# Patient Record
Sex: Female | Born: 1940 | Race: White | Hispanic: No | Marital: Single | Smoking: Never smoker
Health system: Southern US, Community
[De-identification: ages and names within clinical notes are randomized; demographics above are authoritative.]

## PROBLEM LIST (undated history)

## (undated) DIAGNOSIS — M797 Fibromyalgia: Secondary | ICD-10-CM

## (undated) DIAGNOSIS — E119 Type 2 diabetes mellitus without complications: Secondary | ICD-10-CM

## (undated) DIAGNOSIS — J8410 Pulmonary fibrosis, unspecified: Secondary | ICD-10-CM

## (undated) DIAGNOSIS — N189 Chronic kidney disease, unspecified: Secondary | ICD-10-CM

## (undated) DIAGNOSIS — I2510 Atherosclerotic heart disease of native coronary artery without angina pectoris: Secondary | ICD-10-CM

## (undated) DIAGNOSIS — M1990 Unspecified osteoarthritis, unspecified site: Secondary | ICD-10-CM

## (undated) DIAGNOSIS — K219 Gastro-esophageal reflux disease without esophagitis: Secondary | ICD-10-CM

## (undated) DIAGNOSIS — Z8719 Personal history of other diseases of the digestive system: Secondary | ICD-10-CM

## (undated) DIAGNOSIS — I10 Essential (primary) hypertension: Secondary | ICD-10-CM

## (undated) DIAGNOSIS — C801 Malignant (primary) neoplasm, unspecified: Secondary | ICD-10-CM

## (undated) HISTORY — PX: COLONOSCOPY: SHX174

## (undated) HISTORY — DX: Pulmonary fibrosis, unspecified: J84.10

## (undated) HISTORY — PX: BREAST BIOPSY: SHX20

## (undated) HISTORY — PX: TONSILLECTOMY: SUR1361

## (undated) HISTORY — PX: ABDOMINAL HYSTERECTOMY: SHX81

## (undated) HISTORY — PX: OTHER SURGICAL HISTORY: SHX169

---

## 1998-09-02 ENCOUNTER — Other Ambulatory Visit: Admission: RE | Admit: 1998-09-02 | Discharge: 1998-09-02 | Payer: Self-pay | Admitting: Obstetrics and Gynecology

## 2013-02-26 ENCOUNTER — Ambulatory Visit: Payer: Self-pay | Admitting: Internal Medicine

## 2014-05-03 ENCOUNTER — Emergency Department: Payer: Self-pay | Admitting: Student

## 2014-08-12 ENCOUNTER — Ambulatory Visit: Payer: Self-pay | Admitting: Internal Medicine

## 2015-03-18 ENCOUNTER — Other Ambulatory Visit: Payer: Self-pay | Admitting: Gastroenterology

## 2015-03-18 DIAGNOSIS — R188 Other ascites: Secondary | ICD-10-CM

## 2015-03-24 ENCOUNTER — Other Ambulatory Visit (HOSPITAL_COMMUNITY): Payer: Self-pay

## 2015-10-29 ENCOUNTER — Other Ambulatory Visit: Payer: Self-pay | Admitting: Student

## 2015-10-29 DIAGNOSIS — R7989 Other specified abnormal findings of blood chemistry: Secondary | ICD-10-CM

## 2015-10-29 DIAGNOSIS — R945 Abnormal results of liver function studies: Principal | ICD-10-CM

## 2015-11-03 ENCOUNTER — Ambulatory Visit
Admission: RE | Admit: 2015-11-03 | Discharge: 2015-11-03 | Disposition: A | Discharge status: Home / Self Care | Payer: Medicare Other | Source: Ambulatory Visit | Attending: Student | Admitting: Student

## 2015-11-03 DIAGNOSIS — K8020 Calculus of gallbladder without cholecystitis without obstruction: Secondary | ICD-10-CM | POA: Insufficient documentation

## 2015-11-03 DIAGNOSIS — R945 Abnormal results of liver function studies: Secondary | ICD-10-CM

## 2015-11-03 DIAGNOSIS — R748 Abnormal levels of other serum enzymes: Secondary | ICD-10-CM | POA: Diagnosis present

## 2015-11-03 DIAGNOSIS — R7989 Other specified abnormal findings of blood chemistry: Secondary | ICD-10-CM

## 2016-10-17 ENCOUNTER — Encounter: Payer: Self-pay | Admitting: Emergency Medicine

## 2016-10-17 ENCOUNTER — Emergency Department
Admission: EM | Admit: 2016-10-17 | Discharge: 2016-10-17 | Disposition: A | Discharge status: Home / Self Care | Payer: Medicare Other | Attending: Emergency Medicine | Admitting: Emergency Medicine

## 2016-10-17 DIAGNOSIS — R202 Paresthesia of skin: Secondary | ICD-10-CM | POA: Insufficient documentation

## 2016-10-17 DIAGNOSIS — Z5321 Procedure and treatment not carried out due to patient leaving prior to being seen by health care provider: Secondary | ICD-10-CM | POA: Diagnosis not present

## 2016-10-17 DIAGNOSIS — E119 Type 2 diabetes mellitus without complications: Secondary | ICD-10-CM | POA: Diagnosis not present

## 2016-10-17 DIAGNOSIS — I2510 Atherosclerotic heart disease of native coronary artery without angina pectoris: Secondary | ICD-10-CM | POA: Diagnosis not present

## 2016-10-17 HISTORY — DX: Type 2 diabetes mellitus without complications: E11.9

## 2016-10-17 HISTORY — DX: Atherosclerotic heart disease of native coronary artery without angina pectoris: I25.10

## 2016-10-17 HISTORY — DX: Fibromyalgia: M79.7

## 2016-10-17 HISTORY — DX: Unspecified osteoarthritis, unspecified site: M19.90

## 2016-10-17 NOTE — ED Triage Notes (Signed)
Pt also c/o fatigue and feeling drained, doesn't feel like doing anything except sleeping.

## 2016-10-17 NOTE — ED Notes (Signed)
Pt does not want EKG or labs until sees a doctor.

## 2016-10-17 NOTE — ED Notes (Addendum)
Pt now does not wish to wait to see a doctor.  Will return if wants to be seen.  NAD

## 2016-10-17 NOTE — ED Triage Notes (Signed)
Pt c/o tingling to left ear/side of face for 2-3 weeks. No numbness or pain per pt.  When turns head certain ways it makes the tingling start. When lays on pillow certain ways the tingling starts.

## 2016-10-23 ENCOUNTER — Other Ambulatory Visit: Payer: Self-pay | Admitting: Internal Medicine

## 2016-10-23 DIAGNOSIS — Z1231 Encounter for screening mammogram for malignant neoplasm of breast: Secondary | ICD-10-CM

## 2016-11-22 ENCOUNTER — Ambulatory Visit
Admission: RE | Admit: 2016-11-22 | Discharge: 2016-11-22 | Disposition: A | Discharge status: Home / Self Care | Payer: Medicare Other | Source: Ambulatory Visit | Attending: Internal Medicine | Admitting: Internal Medicine

## 2016-11-22 DIAGNOSIS — Z1231 Encounter for screening mammogram for malignant neoplasm of breast: Secondary | ICD-10-CM | POA: Insufficient documentation

## 2016-11-22 HISTORY — DX: Malignant (primary) neoplasm, unspecified: C80.1

## 2017-03-06 ENCOUNTER — Other Ambulatory Visit: Payer: Self-pay | Admitting: Internal Medicine

## 2017-03-06 DIAGNOSIS — M542 Cervicalgia: Secondary | ICD-10-CM

## 2017-03-12 ENCOUNTER — Ambulatory Visit
Admission: RE | Admit: 2017-03-12 | Discharge: 2017-03-12 | Disposition: A | Discharge status: Home / Self Care | Payer: Medicare Other | Source: Ambulatory Visit | Attending: Internal Medicine | Admitting: Internal Medicine

## 2017-03-12 DIAGNOSIS — M47892 Other spondylosis, cervical region: Secondary | ICD-10-CM | POA: Diagnosis not present

## 2017-03-12 DIAGNOSIS — M4802 Spinal stenosis, cervical region: Secondary | ICD-10-CM | POA: Insufficient documentation

## 2017-03-12 DIAGNOSIS — M542 Cervicalgia: Secondary | ICD-10-CM

## 2017-05-11 ENCOUNTER — Other Ambulatory Visit: Payer: Self-pay | Admitting: Acute Care

## 2017-05-11 DIAGNOSIS — R202 Paresthesia of skin: Secondary | ICD-10-CM

## 2017-05-23 ENCOUNTER — Ambulatory Visit
Admission: RE | Admit: 2017-05-23 | Discharge: 2017-05-23 | Disposition: A | Discharge status: Home / Self Care | Payer: Medicare Other | Source: Ambulatory Visit | Attending: Acute Care | Admitting: Acute Care

## 2017-05-23 DIAGNOSIS — R202 Paresthesia of skin: Secondary | ICD-10-CM | POA: Diagnosis present

## 2017-05-23 DIAGNOSIS — R9082 White matter disease, unspecified: Secondary | ICD-10-CM | POA: Diagnosis not present

## 2017-05-23 DIAGNOSIS — G319 Degenerative disease of nervous system, unspecified: Secondary | ICD-10-CM | POA: Diagnosis not present

## 2017-09-03 ENCOUNTER — Encounter: Payer: Self-pay | Admitting: Emergency Medicine

## 2017-09-03 ENCOUNTER — Emergency Department: Payer: Medicare Other

## 2017-09-03 ENCOUNTER — Emergency Department
Admission: EM | Admit: 2017-09-03 | Discharge: 2017-09-03 | Disposition: A | Discharge status: Home / Self Care | Payer: Medicare Other | Attending: Emergency Medicine | Admitting: Emergency Medicine

## 2017-09-03 ENCOUNTER — Other Ambulatory Visit: Payer: Self-pay

## 2017-09-03 DIAGNOSIS — Z85828 Personal history of other malignant neoplasm of skin: Secondary | ICD-10-CM | POA: Insufficient documentation

## 2017-09-03 DIAGNOSIS — I2510 Atherosclerotic heart disease of native coronary artery without angina pectoris: Secondary | ICD-10-CM | POA: Diagnosis not present

## 2017-09-03 DIAGNOSIS — R05 Cough: Secondary | ICD-10-CM | POA: Diagnosis present

## 2017-09-03 DIAGNOSIS — Z7984 Long term (current) use of oral hypoglycemic drugs: Secondary | ICD-10-CM | POA: Diagnosis not present

## 2017-09-03 DIAGNOSIS — J209 Acute bronchitis, unspecified: Secondary | ICD-10-CM | POA: Diagnosis not present

## 2017-09-03 DIAGNOSIS — E119 Type 2 diabetes mellitus without complications: Secondary | ICD-10-CM | POA: Insufficient documentation

## 2017-09-03 MED ORDER — PREDNISONE 50 MG PO TABS: ORAL_TABLET | ORAL | 0 refills | Status: DC

## 2017-09-03 MED ORDER — AZITHROMYCIN 250 MG PO TABS: ORAL_TABLET | ORAL | 0 refills | Status: AC

## 2017-09-03 NOTE — ED Triage Notes (Signed)
Pt reports non productive cough and sore throat for one month. Denies NVD. Reports chest wall pain from coughing. No apparent distress noted in triage.

## 2017-09-03 NOTE — ED Triage Notes (Signed)
First Nurse Note:  Arrives with C/O dry cough x 1 month.  No SOB/ DOE.  NAD

## 2017-09-03 NOTE — ED Notes (Signed)
See triage note states she developed cough about 1 month ago  Had fever at the beginning  Afebrile at present

## 2017-09-03 NOTE — ED Provider Notes (Signed)
[facility] Emergency Department Provider Note  ____________________________________________  Time seen: Approximately 6:23 PM  I have reviewed the triage vital signs and the nursing notes.   HISTORY  Chief Complaint Cough    HPI Claudia Schaefer is a 76 y.o. female presents to the emergency department with productive cough for approximately 1 month.  Cough is productive for clear to white sputum production.  Patient has experienced intermittent shortness of breath but no chest tightness or chest pain.  She has been tolerating fluids by mouth and her own secretions.  No major changes in stooling or urinary habits.  No alleviating measures have been attempted.    Past Medical History:  Diagnosis Date  . CAD (coronary artery disease)   . Cancer ([place])    skin  . Diabetes mellitus without complication ([place])   . Fibromyalgia   . Osteoarthritis     There are no active problems to display for this patient.   Past Surgical History:  Procedure Laterality Date  . ABDOMINAL HYSTERECTOMY    . BREAST BIOPSY Right    neg  . CESAREAN SECTION    . TONSILLECTOMY      Prior to Admission medications   Medication Sig Start Date End Date Taking? Authorizing Provider  atorvastatin (LIPITOR) 40 MG tablet Take 40 mg by mouth daily.   Yes [provider]  diltiazem (CARDIZEM CD) 240 MG 24 hr capsule Take 240 mg by mouth daily.   Yes [provider]  esomeprazole (NEXIUM) 40 MG capsule Take 40 mg by mouth daily at 12 noon.   Yes [provider]  glimepiride (AMARYL) 4 MG tablet Take 4 mg by mouth daily with breakfast.   Yes [provider]  isosorbide mononitrate (IMDUR) 60 MG 24 hr tablet Take 60 mg by mouth daily.   Yes [provider]  metFORMIN (GLUCOPHAGE) 500 MG tablet Take 500 mg by mouth 2 (two) times daily with a meal.   Yes [provider]  venlafaxine XR (EFFEXOR-XR) 150 MG 24 hr capsule Take 150 mg by  mouth daily with breakfast.   Yes [provider]  azithromycin (ZITHROMAX Z-PAK) 250 MG tablet Take 2 tablets (500 mg) on  Day 1,  followed by 1 tablet (250 mg) once daily on Days 2 through 5. 09/03/17 09/08/17  Lannie Fields, PA-C  predniSONE (DELTASONE) 50 MG tablet Take one tablet daily by mouth for the next five days. 09/03/17   Lannie Fields, PA-C    Allergies Penicillins and Sulfa antibiotics  Family History  Problem Relation Age of Onset  . Breast cancer Neg Hx     Social History Social History   Tobacco Use  . Smoking status: Never Smoker  . Smokeless tobacco: Never Used  Substance Use Topics  . Alcohol use: No  . Drug use: No     Review of Systems  Constitutional: No fever/chills Eyes: No visual changes. No discharge ENT: No upper respiratory complaints. Cardiovascular: no chest pain. Respiratory: Patient has productive cough . Patient has intermittent shortness of breath.  Gastrointestinal: No abdominal pain.  No nausea, no vomiting.  No diarrhea.  No constipation. Musculoskeletal: Negative for musculoskeletal pain. Skin: Negative for rash, abrasions, lacerations, ecchymosis. Neurological: Negative for headaches, focal weakness or numbness.   ____________________________________________   PHYSICAL EXAM:  VITAL SIGNS: ED Triage Vitals  Enc Vitals Group     BP 09/03/17 1342 126/77     Pulse Rate 09/03/17 1342 91  Resp 09/03/17 1342 18     Temp 09/03/17 1342 98.1 F (36.7 C)     Temp Source 09/03/17 1342 Oral     SpO2 09/03/17 1342 97 %     Weight 09/03/17 1345 120 lb (54.4 kg)     Height 09/03/17 1345 4\' 11"  (1.499 m)     Head Circumference --      Peak Flow --      Pain Score 09/03/17 1555 0     Pain Loc --      Pain Edu? --      Excl. in [place]? --     Constitutional: Alert and oriented. Well appearing and in no acute distress. Eyes: Conjunctivae are normal. PERRL. EOMI. Head: Atraumatic. ENT:      Ears: TMs are pearly  bilaterally.       Nose: No congestion/rhinnorhea.      Mouth/Throat: Mucous membranes are moist.  Neck: No stridor. FROM. Hematological/Lymphatic/Immunilogical: No cervical lymphadenopathy.  Cardiovascular: Normal rate, regular rhythm. Normal S1 and S2.  Good peripheral circulation. Respiratory: Normal respiratory effort without tachypnea or retractions. Lungs CTAB. Good air entry to the bases with no decreased or absent breath sounds. Gastrointestinal: Bowel sounds 4 quadrants. Soft and nontender to palpation. No guarding or rigidity. No palpable masses. No distention. No CVA tenderness. Musculoskeletal: Full range of motion to all extremities. No gross deformities appreciated. Neurologic:  Normal speech and language. No gross focal neurologic deficits are appreciated.  Skin:  Skin is warm, dry and intact. No rash noted. Psychiatric: Mood and affect are normal. Speech and behavior are normal. Patient exhibits appropriate insight and judgement.  ____________________________________________   LABS (all labs ordered are listed, but only abnormal results are displayed)  Labs Reviewed - No data to display ____________________________________________  EKG   ____________________________________________  RADIOLOGY Unk Pinto, personally viewed and evaluated these images (plain radiographs) as part of my medical decision making, as well as reviewing the written report by the radiologist.  Dg Chest 2 View  Result Date: 09/03/2017 CLINICAL DATA:  Cough and sore throat. EXAM: CHEST  2 VIEW COMPARISON:  None. FINDINGS: The heart size is normal. The thoracic aorta demonstrates calcified plaque and mild ectasia. There is some scarring in the lingula and left lower lobe. There is no evidence of pulmonary edema, consolidation, pneumothorax, nodule or pleural fluid. The bony thorax shows mild degenerative disc disease of the thoracic spine. IMPRESSION: No acute findings.  Scarring in the  lingula and left lower lobe. Electronically Signed   By: Aletta Edouard M.D.   On: 09/03/2017 15:15    ____________________________________________    PROCEDURES  Procedure(s) performed:    Procedures    Medications - No data to display   ____________________________________________   INITIAL IMPRESSION / ASSESSMENT AND PLAN / ED COURSE  Pertinent labs & imaging results that were available during my care of the patient were reviewed by me and considered in my medical decision making (see chart for details).  Review of the [place] CSRS was performed in accordance of the [place] prior to dispensing any controlled drugs.     Assessment and Plan: Acute bronchitis Patient presents to with productive cough for approximately 1 month.  Chest x-ray reveals no consolidations or findings consistent with pneumonia.  Patient was treated empirically with azithromycin and prednisone.  She was advised to follow-up with primary care as needed.  All patient questions were answered.   ____________________________________________  FINAL CLINICAL IMPRESSION(S) / ED DIAGNOSES  Final diagnoses:  Acute bronchitis, unspecified organism      NEW MEDICATIONS STARTED DURING THIS VISIT:  ED Discharge Orders        Ordered    azithromycin (ZITHROMAX Z-PAK) 250 MG tablet     09/03/17 1551    predniSONE (DELTASONE) 50 MG tablet     09/03/17 1551          This chart was dictated using voice recognition software/Dragon. Despite best efforts to proofread, errors can occur which can change the meaning. Any change was purely unintentional.    Lannie Fields, PA-C 09/03/17 1831    Delman Kitten, MD 09/13/17 [phone]

## 2018-08-04 ENCOUNTER — Encounter: Payer: Self-pay | Admitting: Emergency Medicine

## 2018-08-04 ENCOUNTER — Emergency Department
Admission: EM | Admit: 2018-08-04 | Discharge: 2018-08-04 | Disposition: A | Discharge status: Home / Self Care | Payer: Medicare Other | Attending: Emergency Medicine | Admitting: Emergency Medicine

## 2018-08-04 ENCOUNTER — Emergency Department: Payer: Medicare Other

## 2018-08-04 ENCOUNTER — Other Ambulatory Visit: Payer: Self-pay

## 2018-08-04 DIAGNOSIS — Z7984 Long term (current) use of oral hypoglycemic drugs: Secondary | ICD-10-CM | POA: Diagnosis not present

## 2018-08-04 DIAGNOSIS — I2510 Atherosclerotic heart disease of native coronary artery without angina pectoris: Secondary | ICD-10-CM | POA: Insufficient documentation

## 2018-08-04 DIAGNOSIS — E860 Dehydration: Secondary | ICD-10-CM | POA: Diagnosis not present

## 2018-08-04 DIAGNOSIS — E119 Type 2 diabetes mellitus without complications: Secondary | ICD-10-CM | POA: Insufficient documentation

## 2018-08-04 DIAGNOSIS — Z79899 Other long term (current) drug therapy: Secondary | ICD-10-CM | POA: Diagnosis not present

## 2018-08-04 DIAGNOSIS — K8500 Idiopathic acute pancreatitis without necrosis or infection: Secondary | ICD-10-CM | POA: Diagnosis not present

## 2018-08-04 DIAGNOSIS — Z85828 Personal history of other malignant neoplasm of skin: Secondary | ICD-10-CM | POA: Diagnosis not present

## 2018-08-04 DIAGNOSIS — R079 Chest pain, unspecified: Secondary | ICD-10-CM | POA: Diagnosis present

## 2018-08-04 LAB — COMPREHENSIVE METABOLIC PANEL
ALK PHOS: 63 U/L (ref 38–126)
ALT: 34 U/L (ref 0–44)
ANION GAP: 11 (ref 5–15)
AST: 31 U/L (ref 15–41)
Albumin: 4 g/dL (ref 3.5–5.0)
BUN: 34 mg/dL — ABNORMAL HIGH (ref 8–23)
CALCIUM: 9.7 mg/dL (ref 8.9–10.3)
CO2: 25 mmol/L (ref 22–32)
CREATININE: 1.25 mg/dL — AB (ref 0.44–1.00)
Chloride: 103 mmol/L (ref 98–111)
GFR, EST AFRICAN AMERICAN: 48 mL/min — AB (ref >60)
GFR, EST NON AFRICAN AMERICAN: 41 mL/min — AB (ref >60)
Glucose, Bld: 297 mg/dL — ABNORMAL HIGH (ref 70–99)
Potassium: 3.9 mmol/L (ref 3.5–5.1)
SODIUM: 139 mmol/L (ref 135–145)
Total Bilirubin: 0.6 mg/dL (ref 0.3–1.2)
Total Protein: 7.5 g/dL (ref 6.5–8.1)

## 2018-08-04 LAB — CBC
HCT: 37.2 % (ref 36.0–46.0)
Hemoglobin: 12.5 g/dL (ref 12.0–15.0)
MCH: 32.1 pg (ref 26.0–34.0)
MCHC: 33.6 g/dL (ref 30.0–36.0)
MCV: 95.4 fL (ref 80.0–100.0)
Platelets: 301 10*3/uL (ref 150–400)
RBC: 3.9 MIL/uL (ref 3.87–5.11)
RDW: 13.7 % (ref 11.5–15.5)
WBC: 8.6 10*3/uL (ref 4.0–10.5)
nRBC: 0 % (ref 0.0–0.2)

## 2018-08-04 LAB — LIPASE, BLOOD: Lipase: 62 U/L — ABNORMAL HIGH (ref 11–51)

## 2018-08-04 LAB — TROPONIN I

## 2018-08-04 MED ORDER — TRAMADOL HCL 50 MG PO TABS: 50.0000 mg | ORAL_TABLET | Freq: Four times a day (QID) | ORAL | 0 refills | Status: DC | PRN

## 2018-08-04 MED ORDER — ONDANSETRON HCL 4 MG PO TABS: 4.0000 mg | ORAL_TABLET | Freq: Three times a day (TID) | ORAL | 0 refills | Status: DC | PRN

## 2018-08-04 MED ORDER — ASPIRIN 81 MG PO CHEW
81.0000 mg | CHEWABLE_TABLET | Freq: Once | ORAL | Status: AC
  Administered 2018-08-04: 81 mg via ORAL
  Filled 2018-08-04: qty 1

## 2018-08-04 MED ORDER — LIDOCAINE VISCOUS HCL 2 % MT SOLN
15.0000 mL | Freq: Once | OROMUCOSAL | Status: AC
  Administered 2018-08-04: 15 mL via OROMUCOSAL
  Filled 2018-08-04: qty 15

## 2018-08-04 MED ORDER — IOPAMIDOL (ISOVUE-300) INJECTION 61%: 30.0000 mL | Freq: Once | INTRAVENOUS | Status: DC | PRN

## 2018-08-04 MED ORDER — IOHEXOL 300 MG/ML  SOLN
75.0000 mL | Freq: Once | INTRAMUSCULAR | Status: AC | PRN
  Administered 2018-08-04: 75 mL via INTRAVENOUS

## 2018-08-04 MED ORDER — ALUM & MAG HYDROXIDE-SIMETH 200-200-20 MG/5ML PO SUSP
30.0000 mL | Freq: Once | ORAL | Status: AC
  Administered 2018-08-04: 30 mL via ORAL
  Filled 2018-08-04: qty 30

## 2018-08-04 MED ORDER — SODIUM CHLORIDE 0.9 % IV BOLUS
1000.0000 mL | Freq: Once | INTRAVENOUS | Status: AC
  Administered 2018-08-04: 1000 mL via INTRAVENOUS

## 2018-08-04 MED ORDER — IOPAMIDOL (ISOVUE-300) INJECTION 61%
30.0000 mL | INTRAVENOUS | Status: AC
  Administered 2018-08-04: 15 mL via ORAL

## 2018-08-04 NOTE — ED Triage Notes (Signed)
Pt arrived via POV with reports of feeling sick and having chest pressure. PT states the chest pain has been going on for 1 week, pt reports she has a lot of gas in abdomen, but is only passing a minimal amount of gas.

## 2018-08-04 NOTE — ED Notes (Signed)
Pt has had one bottle PO contrast, states, "I dont think I can drink the other one."  EDP notified.

## 2018-08-04 NOTE — ED Provider Notes (Signed)
[facility] Emergency Department Provider Note ____________________________________________   I have reviewed the triage vital signs and the triage nursing note.  HISTORY  Chief Complaint Chest Pain and Emesis   Historian Patient  HPI Claudia Schaefer is a 77 y.o. female with history of coronary artery disease, states last cath was about 5 years ago when she was told that she had angioplasty, small vessel disease, as well as some areas of 50% blockage, follows with Dr. Wynetta Emery for primary care and does not follow with a cardiologist, presents with about 1 week of chest pressure.  She states that it is mostly constant, but does wax and wane some.  There is no shortness of breath or trouble breathing.  Location of the chest pressure seems to be the lower anterior chest.  She is been hearing a lot of "gurgling" in her stomach.  She states that she gets nauseated when she eats.  No history of cholecystectomy.  States that she has some amount of diarrhea but nonbloody.  No vomiting.  No fevers.  Pain is considered a pressure and is moderate.  She is not quite sure if movement makes it any worse or walking.  She states that in some ways she seems to go to sleep within a week up in the middle the night and actually feels like it might be gone.  Dates that she came to the ER today because she did not like she could deal with it for a whole another week.     Past Medical History:  Diagnosis Date  . CAD (coronary artery disease)   . Cancer ([place])    skin  . Diabetes mellitus without complication ([place])   . Fibromyalgia   . Osteoarthritis     There are no active problems to display for this patient.   Past Surgical History:  Procedure Laterality Date  . ABDOMINAL HYSTERECTOMY    . BREAST BIOPSY Right    neg  . CESAREAN SECTION    . TONSILLECTOMY      Prior to Admission medications   Medication Sig Start Date End Date Taking? Authorizing Provider   atorvastatin (LIPITOR) 40 MG tablet Take 40 mg by mouth daily.    [provider]  diltiazem (CARDIZEM CD) 240 MG 24 hr capsule Take 240 mg by mouth daily.    [provider]  esomeprazole (NEXIUM) 40 MG capsule Take 40 mg by mouth daily at 12 noon.    [provider]  glimepiride (AMARYL) 4 MG tablet Take 4 mg by mouth daily with breakfast.    [provider]  isosorbide mononitrate (IMDUR) 60 MG 24 hr tablet Take 60 mg by mouth daily.    [provider]  metFORMIN (GLUCOPHAGE) 500 MG tablet Take 500 mg by mouth 2 (two) times daily with a meal.    [provider]  ondansetron (ZOFRAN) 4 MG tablet Take 1 tablet (4 mg total) by mouth every 8 (eight) hours as needed for nausea or vomiting. 08/04/18   Lisa Roca, MD  predniSONE (DELTASONE) 50 MG tablet Take one tablet daily by mouth for the next five days. 09/03/17   Lannie Fields, PA-C  traMADol (ULTRAM) 50 MG tablet Take 1 tablet (50 mg total) by mouth every 6 (six) hours as needed. 08/04/18   Lisa Roca, MD  venlafaxine XR (EFFEXOR-XR) 150 MG 24 hr capsule Take 150 mg by mouth daily with breakfast.    [provider]    Allergies  Allergen  Reactions  . Penicillin V Potassium     Other reaction(s): Unknown  . Penicillins Rash  . Sulfa Antibiotics Rash and Other (See Comments)    Family History  Problem Relation Age of Onset  . Breast cancer Neg Hx     Social History Social History   Tobacco Use  . Smoking status: Never Smoker  . Smokeless tobacco: Never Used  Substance Use Topics  . Alcohol use: No  . Drug use: No    Review of Systems  Constitutional: Negative for fever. Eyes: Negative for visual changes. ENT: Negative for sore throat. Cardiovascular: Chest pressure as per HPI.   Respiratory: Negative for shortness of breath. Gastrointestinal: Negative for vomiting.  Positive for some loose stools.  Mild epigastric fullness and belching without real  abdominal pain. Genitourinary: Negative for dysuria. Musculoskeletal: Negative for back pain. Skin: Negative for rash. Neurological: Negative for headache.  ____________________________________________   PHYSICAL EXAM:  VITAL SIGNS: ED Triage Vitals  Enc Vitals Group     BP 08/04/18 1131 (!) 144/71     Pulse Rate 08/04/18 1131 86     Resp --      Temp 08/04/18 1131 98.2 F (36.8 C)     Temp Source 08/04/18 1131 Oral     SpO2 08/04/18 1131 97 %     Weight 08/04/18 1131 114 lb (51.7 kg)     Height 08/04/18 1131 4\' 11"  (1.499 m)     Head Circumference --      Peak Flow --      Pain Score 08/04/18 1134 2     Pain Loc --      Pain Edu? --      Excl. in [place]? --      Constitutional: Alert and oriented.  HEENT      Head: Normocephalic and atraumatic.      Eyes: Conjunctivae are normal. Pupils equal and round.       Ears:         Nose: No congestion/rhinnorhea.      Mouth/Throat: Mucous membranes are moist.      Neck: No stridor. Cardiovascular/Chest: Normal rate, regular rhythm.  No murmurs, rubs, or gallops. Respiratory: Normal respiratory effort without tachypnea nor retractions. Breath sounds are clear and equal bilaterally. No wheezes/rales/rhonchi. Gastrointestinal: Soft. No distention, no guarding, no rebound.  Mild discomfort in the epigastrium to palpation without focal right upper quadrant tenderness.  No lower abdominal tenderness palpation. Genitourinary/rectal:Deferred Musculoskeletal: Nontender with normal range of motion in all extremities. No joint effusions.  No lower extremity tenderness.  No edema. Neurologic:  Normal speech and language. No gross or focal neurologic deficits are appreciated. Skin:  Skin is warm, dry and intact. No rash noted. Psychiatric: Mood and affect are normal. Speech and behavior are normal. Patient exhibits appropriate insight and judgment.   ____________________________________________  LABS (pertinent positives/negatives) I,  Lisa Roca, MD the attending physician have reviewed the labs noted below.  Labs Reviewed  LIPASE, BLOOD - Abnormal; Notable for the following components:      Result Value   Lipase 62 (*)    All other components within normal limits  COMPREHENSIVE METABOLIC PANEL - Abnormal; Notable for the following components:   Glucose, Bld 297 (*)    BUN 34 (*)    Creatinine, Ser 1.25 (*)    GFR calc non Af Amer 41 (*)    GFR calc Af Amer 48 (*)    All other components within normal limits  CBC  TROPONIN I  URINALYSIS, COMPLETE (UACMP) WITH MICROSCOPIC    ____________________________________________    EKG I, Lisa Roca, MD, the attending physician have personally viewed and interpreted all ECGs.  87 bpm.  Normal sinus rhythm.  Short PR.  Narrow QRS.  Normal axis.  Nonspecific ST/T wave. ____________________________________________  RADIOLOGY   Chest x-ray two-view:  IMPRESSION: Linear subsegmental atelectasis versus scarring in the anterior lung bases.  No acute abnormalities.  Ct abd and pel w contrast:  Pending __________________________________________  PROCEDURES  Procedure(s) performed: None  Procedures  Critical Care performed: None   ____________________________________________  ED COURSE / ASSESSMENT AND PLAN  Pertinent labs & imaging results that were available during my care of the patient were reviewed by me and considered in my medical decision making (see chart for details).     Suspect more epigastric rather than chest discomfort.  ECG and troponin reassuring.  Lipase slightly elevated , discussed dx of pancreatitis and treat with diet control at home.  Given first episode, will image abdomen.  Care transferred to Dr. Corky Downs at 3:45 pm.  CT abd pending - if reassuring, may be discharged with my prepared discharge instructions.    CONSULTATIONS:  None   Patient / Family / Caregiver informed of clinical course, medical decision-making  process, and agree with plan.   I discussed return precautions, follow-up instructions, and discharge instructions with patient and/or family.  Discharge Instructions : You are evaluated for upper abdominal pain and found to have pancreatitis which is inflammation of the pancreas.  Please go on to a liquid diet for about 2 days and then if you are tolerating that well, you could increase to a soft diet and then to regular diet as you tolerated.  Make sure you are drinking plenty fluids.   You were treated for dehydration with IV fluids here.  Return to the emergency department immediately for any worsening condition including worsening uncontrolled abdominal pain, vomiting blood, black or bloody stools, chest pain, or any other symptoms concerning to you.    ___________________________________________   FINAL CLINICAL IMPRESSION(S) / ED DIAGNOSES   Final diagnoses:  Dehydration  Idiopathic acute pancreatitis without infection or necrosis      ___________________________________________         Note: This dictation was prepared with Dragon dictation. Any transcriptional errors that result from this process are unintentional    Lisa Roca, MD 08/04/18 1546

## 2018-08-04 NOTE — Discharge Instructions (Addendum)
Remember you will need a follow-up MRI of your pancreas please communicate this to your PCP   You are evaluated for upper abdominal pain and found to have pancreatitis which is inflammation of the pancreas.  Please go on to a liquid diet for about 2 days and then if you are tolerating that well, you could increase to a soft diet and then to regular diet as you tolerated.  Make sure you are drinking plenty fluids.   You were treated for dehydration with IV fluids here.  Return to the emergency department immediately for any worsening condition including worsening uncontrolled abdominal pain, vomiting blood, black or bloody stools, chest pain, or any other symptoms concerning to you.

## 2018-08-04 NOTE — ED Notes (Signed)
Patient transported to CT 

## 2018-08-04 NOTE — ED Notes (Signed)
Pt ambulatory independently to toilet.

## 2018-08-04 NOTE — ED Provider Notes (Signed)
CT results with patient, she is feeling well and ready for discharge.  She will need follow-up MRI, discussed this with patient   Claudia Drafts, MD 08/04/18 1731

## 2018-08-04 NOTE — ED Notes (Signed)
ED Provider at bedside. 

## 2018-08-04 NOTE — ED Notes (Signed)
EDP to bedside to provide patient with update.

## 2018-08-04 NOTE — ED Notes (Signed)
Inadequate amount of urine for analysis per lab. Will notify pt and attempt to recollect.

## 2018-08-07 ENCOUNTER — Other Ambulatory Visit: Payer: Self-pay | Admitting: Internal Medicine

## 2018-08-07 DIAGNOSIS — K859 Acute pancreatitis without necrosis or infection, unspecified: Secondary | ICD-10-CM

## 2018-08-21 ENCOUNTER — Ambulatory Visit
Admission: RE | Admit: 2018-08-21 | Discharge: 2018-08-21 | Disposition: A | Discharge status: Home / Self Care | Payer: Medicare Other | Source: Ambulatory Visit | Attending: Internal Medicine | Admitting: Internal Medicine

## 2018-08-21 DIAGNOSIS — K859 Acute pancreatitis without necrosis or infection, unspecified: Secondary | ICD-10-CM | POA: Insufficient documentation

## 2018-08-21 MED ORDER — GADOBUTROL 1 MMOL/ML IV SOLN
5.0000 mL | Freq: Once | INTRAVENOUS | Status: AC | PRN
  Administered 2018-08-21: 5 mL via INTRAVENOUS

## 2018-08-28 ENCOUNTER — Ambulatory Visit: Payer: Medicare Other

## 2018-09-23 ENCOUNTER — Other Ambulatory Visit: Payer: Self-pay | Admitting: Student

## 2018-09-23 ENCOUNTER — Other Ambulatory Visit
Admission: RE | Admit: 2018-09-23 | Discharge: 2018-09-23 | Disposition: A | Discharge status: Home / Self Care | Payer: Medicare Other | Source: Ambulatory Visit | Attending: Student | Admitting: Student

## 2018-09-23 DIAGNOSIS — K30 Functional dyspepsia: Secondary | ICD-10-CM

## 2018-09-23 DIAGNOSIS — K529 Noninfective gastroenteritis and colitis, unspecified: Secondary | ICD-10-CM | POA: Insufficient documentation

## 2018-09-23 LAB — GASTROINTESTINAL PANEL BY PCR, STOOL (REPLACES STOOL CULTURE)
ASTROVIRUS: NOT DETECTED
Adenovirus F40/41: NOT DETECTED
CAMPYLOBACTER SPECIES: NOT DETECTED
CRYPTOSPORIDIUM: NOT DETECTED
CYCLOSPORA CAYETANENSIS: NOT DETECTED
ENTEROTOXIGENIC E COLI (ETEC): NOT DETECTED
Entamoeba histolytica: NOT DETECTED
Enteroaggregative E coli (EAEC): NOT DETECTED
Enteropathogenic E coli (EPEC): NOT DETECTED
Giardia lamblia: NOT DETECTED
Norovirus GI/GII: NOT DETECTED
PLESIMONAS SHIGELLOIDES: NOT DETECTED
ROTAVIRUS A: NOT DETECTED
SAPOVIRUS (I, II, IV, AND V): NOT DETECTED
SHIGA LIKE TOXIN PRODUCING E COLI (STEC): NOT DETECTED
Salmonella species: NOT DETECTED
Shigella/Enteroinvasive E coli (EIEC): NOT DETECTED
VIBRIO SPECIES: NOT DETECTED
Vibrio cholerae: NOT DETECTED
YERSINIA ENTEROCOLITICA: NOT DETECTED

## 2018-09-23 LAB — C DIFFICILE QUICK SCREEN W PCR REFLEX
C Diff antigen: NEGATIVE
C Diff interpretation: NOT DETECTED
C Diff toxin: NEGATIVE

## 2018-09-25 LAB — H. PYLORI ANTIGEN, STOOL: H. Pylori Stool Ag, Eia: NEGATIVE

## 2018-09-25 LAB — CALPROTECTIN, FECAL: Calprotectin, Fecal: 29 ug/g (ref 0–120)

## 2018-09-26 LAB — PANCREATIC ELASTASE, FECAL: Pancreatic Elastase-1, Stool: 326 ug Elast./g (ref >200)

## 2018-10-01 ENCOUNTER — Ambulatory Visit
Admission: RE | Admit: 2018-10-01 | Discharge: 2018-10-01 | Disposition: A | Discharge status: Home / Self Care | Payer: Medicare Other | Source: Ambulatory Visit | Attending: Student | Admitting: Student

## 2018-10-01 ENCOUNTER — Other Ambulatory Visit: Payer: Self-pay | Admitting: Student

## 2018-10-01 DIAGNOSIS — R630 Anorexia: Secondary | ICD-10-CM

## 2018-10-01 DIAGNOSIS — K30 Functional dyspepsia: Secondary | ICD-10-CM | POA: Insufficient documentation

## 2019-04-27 ENCOUNTER — Emergency Department
Admission: EM | Admit: 2019-04-27 | Discharge: 2019-04-27 | Disposition: A | Discharge status: Home / Self Care | Payer: Medicare Other | Attending: Emergency Medicine | Admitting: Emergency Medicine

## 2019-04-27 ENCOUNTER — Other Ambulatory Visit: Payer: Self-pay

## 2019-04-27 ENCOUNTER — Encounter: Payer: Self-pay | Admitting: Intensive Care

## 2019-04-27 DIAGNOSIS — R21 Rash and other nonspecific skin eruption: Secondary | ICD-10-CM | POA: Diagnosis present

## 2019-04-27 DIAGNOSIS — L239 Allergic contact dermatitis, unspecified cause: Secondary | ICD-10-CM | POA: Diagnosis not present

## 2019-04-27 DIAGNOSIS — I2510 Atherosclerotic heart disease of native coronary artery without angina pectoris: Secondary | ICD-10-CM | POA: Diagnosis not present

## 2019-04-27 DIAGNOSIS — Z79899 Other long term (current) drug therapy: Secondary | ICD-10-CM | POA: Insufficient documentation

## 2019-04-27 DIAGNOSIS — Z7984 Long term (current) use of oral hypoglycemic drugs: Secondary | ICD-10-CM | POA: Diagnosis not present

## 2019-04-27 DIAGNOSIS — E119 Type 2 diabetes mellitus without complications: Secondary | ICD-10-CM | POA: Insufficient documentation

## 2019-04-27 DIAGNOSIS — Z880 Allergy status to penicillin: Secondary | ICD-10-CM | POA: Insufficient documentation

## 2019-04-27 DIAGNOSIS — Z85828 Personal history of other malignant neoplasm of skin: Secondary | ICD-10-CM | POA: Diagnosis not present

## 2019-04-27 LAB — GLUCOSE, CAPILLARY: Glucose-Capillary: 174 mg/dL — ABNORMAL HIGH (ref 70–99)

## 2019-04-27 MED ORDER — PREDNISONE 10 MG PO TABS: ORAL_TABLET | ORAL | 0 refills | Status: DC

## 2019-04-27 NOTE — Discharge Instructions (Signed)
Follow-up with your primary care provider if any continued problems or concerns.  You may continue using your steroid cream that you have in your purse.  Use a small amount on your face.  You may use this behind her left ear and near your nose.  Avoid using it around your eye.  And you may also use it on your hands.  The medication to take by mouth was sent to your pharmacy in [place].  This medicine is once a day for the next 4 days.  Also pay close attention what you are eating because of your diabetes.  Your primary care provider can send you to the [facility] Center to go over foods to help reduce your blood sugar if you desire.

## 2019-04-27 NOTE — ED Triage Notes (Addendum)
Patient has rash present on left side of face around eye, nose and face.rash present on bilateral hands and itching. Reports being around brother recently who has shingles. Denies trouble breathing or swallowing. Reports a burning/itchy feeling

## 2019-04-27 NOTE — ED Provider Notes (Signed)
[facility] Emergency Department Provider Note   ____________________________________________   First MD Initiated Contact with Patient 04/27/19 1242     (approximate)  I have reviewed the triage vital signs and the nursing notes.   HISTORY  Chief Complaint Rash   HPI Claudia Schaefer is a 78 y.o. female presents to the ED with complaint of rash on left side of her face around her eye and down at the end of her nose and behind her left ear.  She also has the same type of rash on her hands bilaterally.  Patient states that the rash itches.  She is also worried because she has a brother who has shingles currently now.  Patient states that she has been working out in her yard and may have come in contact with something also.  She denies any pain.     Past Medical History:  Diagnosis Date  . CAD (coronary artery disease)   . Cancer ([place])    skin  . Diabetes mellitus without complication ([place])   . Fibromyalgia   . Osteoarthritis     There are no active problems to display for this patient.   Past Surgical History:  Procedure Laterality Date  . ABDOMINAL HYSTERECTOMY    . BREAST BIOPSY Right    neg  . CESAREAN SECTION    . TONSILLECTOMY      Prior to Admission medications   Medication Sig Start Date End Date Taking? Authorizing Provider  atorvastatin (LIPITOR) 40 MG tablet Take 40 mg by mouth daily.    [provider]  diltiazem (CARDIZEM CD) 240 MG 24 hr capsule Take 240 mg by mouth daily.    [provider]  esomeprazole (NEXIUM) 40 MG capsule Take 40 mg by mouth daily at 12 noon.    [provider]  glimepiride (AMARYL) 4 MG tablet Take 4 mg by mouth daily with breakfast.    [provider]  isosorbide mononitrate (IMDUR) 60 MG 24 hr tablet Take 60 mg by mouth daily.    [provider]  metFORMIN (GLUCOPHAGE) 500 MG tablet Take 500 mg by mouth 2 (two) times daily with a meal.    [provider]  predniSONE (DELTASONE) 10 MG tablet Take 3 tablets once a day 04/27/19   Johnn Hai, PA-C  venlafaxine XR (EFFEXOR-XR) 150 MG 24 hr capsule Take 150 mg by mouth daily with breakfast.    [provider]    Allergies Penicillin v potassium, Penicillins, and Sulfa antibiotics  Family History  Problem Relation Age of Onset  . Breast cancer Neg Hx     Social History Social History   Tobacco Use  . Smoking status: Never Smoker  . Smokeless tobacco: Never Used  Substance Use Topics  . Alcohol use: Yes    Comment: rare  . Drug use: No    Review of Systems Constitutional: No fever/chills Cardiovascular: Denies chest pain. Respiratory: Denies shortness of breath. Musculoskeletal: Negative for back pain. Skin: Positive for rash. Neurological: Negative for headaches, focal weakness or numbness. ___________________________________________   PHYSICAL EXAM:  VITAL SIGNS: ED Triage Vitals  Enc Vitals Group     BP 04/27/19 1232 (!) 183/79     Pulse Rate 04/27/19 1230 89     Resp 04/27/19 1230 16     Temp 04/27/19 1230 98.1 F (36.7 C)     Temp Source 04/27/19 1230 Oral     SpO2 04/27/19 1230 98 %  Weight 04/27/19 1231 120 lb (54.4 kg)     Height 04/27/19 1231 4\' 11"  (1.499 m)     Head Circumference --      Peak Flow --      Pain Score 04/27/19 1231 5     Pain Loc --      Pain Edu? --      Excl. in [place]? --    Constitutional: Alert and oriented. Well appearing and in no acute distress. Eyes: Conjunctivae are normal.  Head: Atraumatic. Neck: No stridor.   Cardiovascular: Normal rate, regular rhythm. Grossly normal heart sounds.  Good peripheral circulation. Respiratory: Normal respiratory effort.  No retractions. Lungs CTAB. Gastrointestinal: Soft and nontender. No distention. Musculoskeletal: Moves upper and lower extremities without any difficulty and normal gait was noted. Neurologic:  Normal speech and language. No gross focal  neurologic deficits are appreciated. No gait instability. Skin:  Skin is warm, dry.  There are some clear vesicles noted on the digits bilateral hands.  There is an erythematous rash noted on the neck left lateral aspect.  The same rash is noted on the left side of her face especially at the base of the left naris.  Patient also has involvement infraorbital area without active drainage.  Vesicles are consistent with a contact dermatitis and patient was reassured that this is not shingles. Psychiatric: Mood and affect are normal. Speech and behavior are normal.  ____________________________________________   LABS (all labs ordered are listed, but only abnormal results are displayed)  Labs Reviewed  GLUCOSE, CAPILLARY - Abnormal; Notable for the following components:      Result Value   Glucose-Capillary 174 (*)    All other components within normal limits  CBG MONITORING, ED    PROCEDURES  Procedure(s) performed (including Critical Care):  Procedures   ____________________________________________   INITIAL IMPRESSION / ASSESSMENT AND PLAN / ED COURSE  As part of my medical decision making, I reviewed the following data within the electronic MEDICAL RECORD NUMBER Notes from prior ED visits and  Controlled Substance Database  78 year old female presents to the ED with complaint of rash to her left side of her face, neck and bilateral hands.  Patient states that she has been doing yard work and it is possible that she came in contact with something.  She states the area itches.  Patient currently has some triamcinolone 1% cream with her and was told that she may use this except for around the eye.  Nonfasting blood sugar in the ED today was 174.  This is approximately 3 hours after having a bowl of cereal and milk.  We discussed her diet in relationship to her diabetes.  Patient currently is drinking a regular root beer in the room and was not aware that she should be drinking diet sodas.   She is encouraged to carefully watch her diet for added sugars while taking the prednisone.  She was given a prescription for 30 mg once daily for the next 4 days to help with the contact dermatitis around her left eye.  She is encouraged to follow-up with her PCP if any continued problems.  ____________________________________________   FINAL CLINICAL IMPRESSION(S) / ED DIAGNOSES  Final diagnoses:  Allergic contact dermatitis, unspecified trigger     ED Discharge Orders         Ordered    predniSONE (DELTASONE) 10 MG tablet     04/27/19 1358           Note:  This document was  prepared using Systems analyst and may include unintentional dictation errors.    Johnn Hai, PA-C 04/27/19 1412    Lavonia Drafts, MD 04/27/19 1434

## 2019-12-12 ENCOUNTER — Observation Stay
Admission: EM | Admit: 2019-12-12 | Discharge: 2019-12-13 | Disposition: A | Discharge status: Home / Self Care | Payer: Medicare Other | Attending: Family Medicine | Admitting: Family Medicine

## 2019-12-12 ENCOUNTER — Other Ambulatory Visit: Payer: Self-pay

## 2019-12-12 ENCOUNTER — Observation Stay
Admit: 2019-12-12 | Discharge: 2019-12-12 | Disposition: A | Discharge status: Home / Self Care | Payer: Medicare Other | Attending: Internal Medicine | Admitting: Internal Medicine

## 2019-12-12 ENCOUNTER — Emergency Department: Payer: Medicare Other

## 2019-12-12 DIAGNOSIS — K5730 Diverticulosis of large intestine without perforation or abscess without bleeding: Secondary | ICD-10-CM | POA: Diagnosis not present

## 2019-12-12 DIAGNOSIS — Z90710 Acquired absence of both cervix and uterus: Secondary | ICD-10-CM | POA: Diagnosis not present

## 2019-12-12 DIAGNOSIS — Z880 Allergy status to penicillin: Secondary | ICD-10-CM | POA: Insufficient documentation

## 2019-12-12 DIAGNOSIS — Z20822 Contact with and (suspected) exposure to covid-19: Secondary | ICD-10-CM | POA: Diagnosis not present

## 2019-12-12 DIAGNOSIS — I129 Hypertensive chronic kidney disease with stage 1 through stage 4 chronic kidney disease, or unspecified chronic kidney disease: Secondary | ICD-10-CM | POA: Insufficient documentation

## 2019-12-12 DIAGNOSIS — K589 Irritable bowel syndrome without diarrhea: Secondary | ICD-10-CM | POA: Insufficient documentation

## 2019-12-12 DIAGNOSIS — R1084 Generalized abdominal pain: Secondary | ICD-10-CM | POA: Diagnosis present

## 2019-12-12 DIAGNOSIS — K8020 Calculus of gallbladder without cholecystitis without obstruction: Secondary | ICD-10-CM | POA: Diagnosis not present

## 2019-12-12 DIAGNOSIS — M797 Fibromyalgia: Secondary | ICD-10-CM | POA: Diagnosis not present

## 2019-12-12 DIAGNOSIS — K219 Gastro-esophageal reflux disease without esophagitis: Secondary | ICD-10-CM | POA: Diagnosis present

## 2019-12-12 DIAGNOSIS — N183 Chronic kidney disease, stage 3 unspecified: Secondary | ICD-10-CM | POA: Diagnosis not present

## 2019-12-12 DIAGNOSIS — R6883 Chills (without fever): Secondary | ICD-10-CM | POA: Diagnosis not present

## 2019-12-12 DIAGNOSIS — R5381 Other malaise: Secondary | ICD-10-CM | POA: Insufficient documentation

## 2019-12-12 DIAGNOSIS — Z79899 Other long term (current) drug therapy: Secondary | ICD-10-CM | POA: Diagnosis not present

## 2019-12-12 DIAGNOSIS — R079 Chest pain, unspecified: Secondary | ICD-10-CM | POA: Diagnosis present

## 2019-12-12 DIAGNOSIS — I160 Hypertensive urgency: Secondary | ICD-10-CM | POA: Diagnosis not present

## 2019-12-12 DIAGNOSIS — M1990 Unspecified osteoarthritis, unspecified site: Secondary | ICD-10-CM | POA: Insufficient documentation

## 2019-12-12 DIAGNOSIS — R0789 Other chest pain: Secondary | ICD-10-CM | POA: Diagnosis not present

## 2019-12-12 DIAGNOSIS — I700 Atherosclerosis of aorta: Secondary | ICD-10-CM | POA: Diagnosis not present

## 2019-12-12 DIAGNOSIS — Z7984 Long term (current) use of oral hypoglycemic drugs: Secondary | ICD-10-CM | POA: Insufficient documentation

## 2019-12-12 DIAGNOSIS — I10 Essential (primary) hypertension: Secondary | ICD-10-CM | POA: Diagnosis present

## 2019-12-12 DIAGNOSIS — I081 Rheumatic disorders of both mitral and tricuspid valves: Secondary | ICD-10-CM | POA: Insufficient documentation

## 2019-12-12 DIAGNOSIS — K5900 Constipation, unspecified: Secondary | ICD-10-CM | POA: Diagnosis not present

## 2019-12-12 DIAGNOSIS — E1122 Type 2 diabetes mellitus with diabetic chronic kidney disease: Secondary | ICD-10-CM | POA: Diagnosis not present

## 2019-12-12 DIAGNOSIS — K449 Diaphragmatic hernia without obstruction or gangrene: Secondary | ICD-10-CM | POA: Insufficient documentation

## 2019-12-12 DIAGNOSIS — Z7952 Long term (current) use of systemic steroids: Secondary | ICD-10-CM | POA: Insufficient documentation

## 2019-12-12 DIAGNOSIS — I2510 Atherosclerotic heart disease of native coronary artery without angina pectoris: Secondary | ICD-10-CM | POA: Insufficient documentation

## 2019-12-12 DIAGNOSIS — Z882 Allergy status to sulfonamides status: Secondary | ICD-10-CM | POA: Diagnosis not present

## 2019-12-12 HISTORY — DX: Essential (primary) hypertension: I10

## 2019-12-12 LAB — COMPREHENSIVE METABOLIC PANEL
ALT: 22 U/L (ref 0–44)
AST: 23 U/L (ref 15–41)
Albumin: 3.9 g/dL (ref 3.5–5.0)
Alkaline Phosphatase: 64 U/L (ref 38–126)
Anion gap: 7 (ref 5–15)
BUN: 31 mg/dL — ABNORMAL HIGH (ref 8–23)
CO2: 27 mmol/L (ref 22–32)
Calcium: 9.4 mg/dL (ref 8.9–10.3)
Chloride: 108 mmol/L (ref 98–111)
Creatinine, Ser: 1 mg/dL (ref 0.44–1.00)
GFR calc Af Amer: >60 mL/min (ref >60)
GFR calc non Af Amer: 55 mL/min — ABNORMAL LOW (ref >60)
Glucose, Bld: 110 mg/dL — ABNORMAL HIGH (ref 70–99)
Potassium: 4.3 mmol/L (ref 3.5–5.1)
Sodium: 142 mmol/L (ref 135–145)
Total Bilirubin: 0.5 mg/dL (ref 0.3–1.2)
Total Protein: 7.4 g/dL (ref 6.5–8.1)

## 2019-12-12 LAB — RESPIRATORY PANEL BY RT PCR (FLU A&B, COVID)
Influenza A by PCR: NEGATIVE
Influenza B by PCR: NEGATIVE
SARS Coronavirus 2 by RT PCR: NEGATIVE

## 2019-12-12 LAB — LIPASE, BLOOD: Lipase: 49 U/L (ref 11–51)

## 2019-12-12 LAB — CBC
HCT: 33.2 % — ABNORMAL LOW (ref 36.0–46.0)
Hemoglobin: 11.8 g/dL — ABNORMAL LOW (ref 12.0–15.0)
MCH: 34.3 pg — ABNORMAL HIGH (ref 26.0–34.0)
MCHC: 35.5 g/dL (ref 30.0–36.0)
MCV: 96.5 fL (ref 80.0–100.0)
Platelets: 221 10*3/uL (ref 150–400)
RBC: 3.44 MIL/uL — ABNORMAL LOW (ref 3.87–5.11)
RDW: 14.8 % (ref 11.5–15.5)
WBC: 6.1 10*3/uL (ref 4.0–10.5)
nRBC: 0 % (ref 0.0–0.2)

## 2019-12-12 LAB — TROPONIN I (HIGH SENSITIVITY)
Troponin I (High Sensitivity): 6 ng/L (ref <18)
Troponin I (High Sensitivity): 7 ng/L (ref <18)
Troponin I (High Sensitivity): 7 ng/L (ref <18)

## 2019-12-12 LAB — HEMOGLOBIN A1C
Hgb A1c MFr Bld: 7.2 % — ABNORMAL HIGH (ref 4.8–5.6)
Mean Plasma Glucose: 159.94 mg/dL

## 2019-12-12 LAB — GLUCOSE, CAPILLARY
Glucose-Capillary: 90 mg/dL (ref 70–99)
Glucose-Capillary: 116 mg/dL — ABNORMAL HIGH (ref 70–99)
Glucose-Capillary: 124 mg/dL — ABNORMAL HIGH (ref 70–99)

## 2019-12-12 LAB — POC SARS CORONAVIRUS 2 AG: SARS Coronavirus 2 Ag: NEGATIVE

## 2019-12-12 LAB — MAGNESIUM: Magnesium: 2.5 mg/dL — ABNORMAL HIGH (ref 1.7–2.4)

## 2019-12-12 MED ORDER — ASPIRIN EC 81 MG PO TBEC
81.0000 mg | DELAYED_RELEASE_TABLET | Freq: Every day | ORAL | Status: DC
  Administered 2019-12-12 – 2019-12-13 (×2): 81 mg via ORAL
  Filled 2019-12-12 (×2): qty 1

## 2019-12-12 MED ORDER — IOHEXOL 350 MG/ML SOLN
100.0000 mL | Freq: Once | INTRAVENOUS | Status: AC | PRN
  Administered 2019-12-12: 07:00:00 100 mL via INTRAVENOUS

## 2019-12-12 MED ORDER — ONDANSETRON HCL 4 MG/2ML IJ SOLN
4.0000 mg | INTRAMUSCULAR | Status: AC
  Administered 2019-12-12: 06:00:00 4 mg via INTRAVENOUS
  Filled 2019-12-12: qty 2

## 2019-12-12 MED ORDER — PANTOPRAZOLE SODIUM 40 MG PO TBEC
40.0000 mg | DELAYED_RELEASE_TABLET | Freq: Every day | ORAL | Status: DC
  Administered 2019-12-12 – 2019-12-13 (×2): 40 mg via ORAL
  Filled 2019-12-12 (×2): qty 1

## 2019-12-12 MED ORDER — ONDANSETRON HCL 4 MG/2ML IJ SOLN
4.0000 mg | Freq: Four times a day (QID) | INTRAMUSCULAR | Status: DC | PRN
  Administered 2019-12-12: 22:00:00 4 mg via INTRAVENOUS
  Filled 2019-12-12: qty 2

## 2019-12-12 MED ORDER — INSULIN ASPART 100 UNIT/ML ~~LOC~~ SOLN
4.0000 [IU] | Freq: Three times a day (TID) | SUBCUTANEOUS | Status: DC
  Administered 2019-12-12 – 2019-12-13 (×2): 4 [IU] via SUBCUTANEOUS
  Filled 2019-12-12 (×2): qty 1

## 2019-12-12 MED ORDER — LORAZEPAM 2 MG/ML IJ SOLN: 0.5000 mg | Freq: Once | INTRAMUSCULAR | Status: AC

## 2019-12-12 MED ORDER — LORAZEPAM 0.5 MG PO TABS
0.5000 mg | ORAL_TABLET | Freq: Once | ORAL | Status: AC
  Administered 2019-12-12: 11:00:00 0.5 mg via ORAL
  Filled 2019-12-12: qty 1

## 2019-12-12 MED ORDER — VENLAFAXINE HCL ER 75 MG PO CP24
150.0000 mg | ORAL_CAPSULE | Freq: Every day | ORAL | Status: DC
  Administered 2019-12-13: 10:00:00 150 mg via ORAL
  Filled 2019-12-12: qty 1
  Filled 2019-12-12: qty 2
  Filled 2019-12-12: qty 1

## 2019-12-12 MED ORDER — LABETALOL HCL 5 MG/ML IV SOLN
INTRAVENOUS | Status: AC
  Filled 2019-12-12: qty 4

## 2019-12-12 MED ORDER — ISOSORBIDE MONONITRATE ER 60 MG PO TB24
60.0000 mg | ORAL_TABLET | Freq: Every day | ORAL | Status: DC
  Administered 2019-12-12 – 2019-12-13 (×2): 60 mg via ORAL
  Filled 2019-12-12 (×2): qty 1

## 2019-12-12 MED ORDER — ENOXAPARIN SODIUM 40 MG/0.4ML ~~LOC~~ SOLN
40.0000 mg | SUBCUTANEOUS | Status: DC
  Administered 2019-12-12: 22:00:00 40 mg via SUBCUTANEOUS
  Filled 2019-12-12: qty 0.4

## 2019-12-12 MED ORDER — MORPHINE SULFATE (PF) 4 MG/ML IV SOLN
4.0000 mg | Freq: Once | INTRAVENOUS | Status: AC
  Administered 2019-12-12: 06:00:00 4 mg via INTRAVENOUS
  Filled 2019-12-12: qty 1

## 2019-12-12 MED ORDER — HYDROMORPHONE HCL 1 MG/ML IJ SOLN
0.5000 mg | Freq: Once | INTRAMUSCULAR | Status: AC | PRN
  Administered 2019-12-12: 07:00:00 0.5 mg via INTRAVENOUS
  Filled 2019-12-12: qty 1

## 2019-12-12 MED ORDER — LABETALOL HCL 5 MG/ML IV SOLN
20.0000 mg | Freq: Once | INTRAVENOUS | Status: AC | PRN
  Administered 2019-12-12: 07:00:00 20 mg via INTRAVENOUS

## 2019-12-12 MED ORDER — SENNA 8.6 MG PO TABS
2.0000 | ORAL_TABLET | Freq: Every day | ORAL | Status: DC
  Administered 2019-12-12: 22:00:00 17.2 mg via ORAL
  Filled 2019-12-12: qty 2

## 2019-12-12 MED ORDER — DILTIAZEM HCL ER COATED BEADS 120 MG PO CP24
240.0000 mg | ORAL_CAPSULE | Freq: Every day | ORAL | Status: DC
  Administered 2019-12-13: 240 mg via ORAL
  Filled 2019-12-12: qty 2
  Filled 2019-12-12 (×3): qty 1

## 2019-12-12 MED ORDER — ATORVASTATIN CALCIUM 20 MG PO TABS
40.0000 mg | ORAL_TABLET | Freq: Every day | ORAL | Status: DC
  Administered 2019-12-12 – 2019-12-13 (×2): 40 mg via ORAL
  Filled 2019-12-12 (×2): qty 2

## 2019-12-12 MED ORDER — INSULIN ASPART 100 UNIT/ML ~~LOC~~ SOLN: 0.0000 [IU] | Freq: Three times a day (TID) | SUBCUTANEOUS | Status: DC

## 2019-12-12 MED ORDER — ACETAMINOPHEN 325 MG PO TABS
650.0000 mg | ORAL_TABLET | ORAL | Status: DC | PRN
  Administered 2019-12-12 – 2019-12-13 (×3): 650 mg via ORAL
  Filled 2019-12-12 (×3): qty 2

## 2019-12-12 NOTE — ED Notes (Signed)
Pt up to restroom.

## 2019-12-12 NOTE — ED Notes (Signed)
Pt given meal tray.

## 2019-12-12 NOTE — ED Notes (Signed)
Lights dimmed for pt as requested. Bed locked low. Rails up. Call bell within reach. Door cracked as pt requested.

## 2019-12-12 NOTE — Progress Notes (Signed)
*  PRELIMINARY RESULTS* Echocardiogram 2D Echocardiogram has been performed.  Claudia Schaefer 12/12/2019, 6:32 PM

## 2019-12-12 NOTE — Consult Note (Signed)
Claudia Schaefer is a 79 y.o. female  [id number]  Primary Cardiologist: Neoma Laming Reason for Consultation: Chest pain  HPI: This is a 79 year old white female with history of nonobstructive coronary artery disease diabetes hypertension fibromyalgia who presented to the hospital with chest pain which woke her her her up in the morning morning with radiating to the abdomen.  She denies any chest pain at this time   Review of Systems: No orthopnea PND or leg swelling   Past Medical History:  Diagnosis Date  . CAD (coronary artery disease)   . Cancer ([place])    skin  . Diabetes mellitus without complication ([place])   . Fibromyalgia   . Hypertension   . Osteoarthritis     (Not in a hospital admission)    . aspirin EC  81 mg Oral Daily  . atorvastatin  40 mg Oral Daily  . diltiazem  240 mg Oral Daily  . enoxaparin (LOVENOX) injection  40 mg Subcutaneous Q24H  . insulin aspart  0-15 Units Subcutaneous TID WC  . insulin aspart  4 Units Subcutaneous TID WC  . isosorbide mononitrate  60 mg Oral Daily  . pantoprazole  40 mg Oral Daily  . senna  2 tablet Oral QHS  . venlafaxine XR  150 mg Oral Q breakfast    Infusions:   Allergies  Allergen Reactions  . Penicillin V Potassium     Other reaction(s): Unknown  . Penicillins Rash    Did it involve swelling of the face/tongue/throat, SOB, or low BP? No Did it involve sudden or severe rash/hives, skin peeling, or any reaction on the inside of your mouth or nose? Yes Did you need to seek medical attention at a hospital or doctor's office? No When did it last happen? If all above answers are "NO", may proceed with cephalosporin use.  . Sulfa Antibiotics Rash and Other (See Comments)    Social History   Socioeconomic History  . Marital status: Single    Spouse name: Not on file  . Number of children: Not on file  . Years of education: Not on file  . Highest education level: Not on file  Occupational History  .  Not on file  Tobacco Use  . Smoking status: Never Smoker  . Smokeless tobacco: Never Used  Substance and Sexual Activity  . Alcohol use: Yes    Comment: rare  . Drug use: No  . Sexual activity: Not on file  Other Topics Concern  . Not on file  Social History Narrative  . Not on file   Social Determinants of Health   Financial Resource Strain:   . Difficulty of Paying Living Expenses:   Food Insecurity:   . Worried About Charity fundraiser in the Last Year:   . Arboriculturist in the Last Year:   Transportation Needs:   . Film/video editor (Medical):   Marland Kitchen Lack of Transportation (Non-Medical):   Physical Activity:   . Days of Exercise per Week:   . Minutes of Exercise per Session:   Stress:   . Feeling of Stress :   Social Connections:   . Frequency of Communication with Friends and Family:   . Frequency of Social Gatherings with Friends and Family:   . Attends Religious Services:   . Active Member of Clubs or Organizations:   . Attends Archivist Meetings:   Marland Kitchen Marital Status:   Intimate Partner Violence:   . Fear of  Current or Ex-Partner:   . Emotionally Abused:   Marland Kitchen Physically Abused:   . Sexually Abused:     Family History  Problem Relation Age of Onset  . Breast cancer Neg Hx     PHYSICAL EXAM: Vitals:   12/12/19 0800 12/12/19 0820  BP: (!) 161/81 140/70  Pulse: 72 65  Resp: 15 14  Temp:    SpO2: 98% 95%    No intake or output data in the 24 hours ending 12/12/19 0843  General:  Well appearing. No respiratory difficulty HEENT: normal Neck: supple. no JVD. Carotids 2+ bilat; no bruits. No lymphadenopathy or thryomegaly appreciated. Cor: PMI nondisplaced. Regular rate & rhythm. No rubs, gallops or murmurs. Lungs: clear Abdomen: soft, nontender, nondistended. No hepatosplenomegaly. No bruits or masses. Good bowel sounds. Extremities: no cyanosis, clubbing, rash, edema Neuro: alert & oriented x 3, cranial nerves grossly intact. moves all  4 extremities w/o difficulty. Affect pleasant.  ECG: Normal sinus rhythm no acute changes  Results for orders placed or performed during the hospital encounter of 12/12/19 (from the past 24 hour(s))  CBC     Status: Abnormal   Collection Time: 12/12/19  6:02 AM  Result Value Ref Range   WBC 6.1 4.0 - 10.5 K/uL   RBC 3.44 (L) 3.87 - 5.11 MIL/uL   Hemoglobin 11.8 (L) 12.0 - 15.0 g/dL   HCT 33.2 (L) 36.0 - 46.0 %   MCV 96.5 80.0 - 100.0 fL   MCH 34.3 (H) 26.0 - 34.0 pg   MCHC 35.5 30.0 - 36.0 g/dL   RDW 14.8 11.5 - 15.5 %   Platelets 221 150 - 400 K/uL   nRBC 0.0 0.0 - 0.2 %  Troponin I (High Sensitivity)     Status: None   Collection Time: 12/12/19  6:02 AM  Result Value Ref Range   Troponin I (High Sensitivity) 6 <18 ng/L  Lipase, blood     Status: None   Collection Time: 12/12/19  6:02 AM  Result Value Ref Range   Lipase 49 11 - 51 U/L  Magnesium     Status: Abnormal   Collection Time: 12/12/19  6:02 AM  Result Value Ref Range   Magnesium 2.5 (H) 1.7 - 2.4 mg/dL  Comprehensive metabolic panel     Status: Abnormal   Collection Time: 12/12/19  6:04 AM  Result Value Ref Range   Sodium 142 135 - 145 mmol/L   Potassium 4.3 3.5 - 5.1 mmol/L   Chloride 108 98 - 111 mmol/L   CO2 27 22 - 32 mmol/L   Glucose, Bld 110 (H) 70 - 99 mg/dL   BUN 31 (H) 8 - 23 mg/dL   Creatinine, Ser 1.00 0.44 - 1.00 mg/dL   Calcium 9.4 8.9 - 10.3 mg/dL   Total Protein 7.4 6.5 - 8.1 g/dL   Albumin 3.9 3.5 - 5.0 g/dL   AST 23 15 - 41 U/L   ALT 22 0 - 44 U/L   Alkaline Phosphatase 64 38 - 126 U/L   Total Bilirubin 0.5 0.3 - 1.2 mg/dL   GFR calc non Af Amer 55 (L) >60 mL/min   GFR calc Af Amer >60 >60 mL/min   Anion gap 7 5 - 15  Respiratory Panel by RT PCR (Flu A&B, Covid) - Nasopharyngeal Swab     Status: None   Collection Time: 12/12/19  6:16 AM   Specimen: Nasopharyngeal Swab  Result Value Ref Range   SARS Coronavirus 2 by RT PCR NEGATIVE  NEGATIVE   Influenza A by PCR NEGATIVE NEGATIVE    Influenza B by PCR NEGATIVE NEGATIVE  POC SARS Coronavirus 2 Ag     Status: None   Collection Time: 12/12/19  7:03 AM  Result Value Ref Range   SARS Coronavirus 2 Ag NEGATIVE NEGATIVE  Troponin I (High Sensitivity)     Status: None   Collection Time: 12/12/19  8:00 AM  Result Value Ref Range   Troponin I (High Sensitivity) 7 <18 ng/L   CT Angio Chest/Abd/Pel for Dissection W and/or W/WO  Result Date: 12/12/2019 CLINICAL DATA:  79 year old female with history of chest pain. Suspected acute aortic syndrome. EXAM: CT ANGIOGRAPHY CHEST, ABDOMEN AND PELVIS TECHNIQUE: Multidetector CT imaging through the chest, abdomen and pelvis was performed using the standard protocol during bolus administration of intravenous contrast. Multiplanar reconstructed images and MIPs were obtained and reviewed to evaluate the vascular anatomy. CONTRAST:  150mL OMNIPAQUE IOHEXOL 350 MG/ML SOLN COMPARISON:  No prior chest CT. CT the abdomen and pelvis 08/04/2018. FINDINGS: CTA CHEST FINDINGS Cardiovascular: Precontrast images demonstrate no crescentic high attenuation associated with the wall of the thoracic aorta to suggest acute intramural hemorrhage. Aortic atherosclerosis, without evidence of aneurysm or dissection. Ascending thoracic aorta, mid aortic arch and descending thoracic aorta are normal in caliber measuring 2.6 cm, 2.0 cm and 2.2 cm in diameter respectively. Heart size is normal. There is no significant pericardial fluid, thickening or pericardial calcification. Mediastinum/Nodes: No pathologically enlarged mediastinal or hilar lymph nodes. Large hiatal hernia. No axillary lymphadenopathy. Lungs/Pleura: No pneumothorax. No acute consolidative airspace disease. No pleural effusions. No suspicious appearing pulmonary nodules or masses are noted. Linear areas of architectural distortion are noted in the lower lungs bilaterally, most compatible with mild chronic scarring. Musculoskeletal: There are no aggressive appearing  lytic or blastic lesions noted in the visualized portions of the skeleton. Review of the MIP images confirms the above findings. CTA ABDOMEN AND PELVIS FINDINGS VASCULAR Aorta: Aortic atherosclerosis. Normal caliber aorta without aneurysm, dissection, vasculitis or significant stenosis. Celiac: Patent without evidence of aneurysm, dissection, vasculitis or significant stenosis. SMA: Patent without evidence of aneurysm, dissection, vasculitis or significant stenosis. Renals: Both renal arteries are patent without evidence of aneurysm, dissection, vasculitis, fibromuscular dysplasia or significant stenosis. IMA: Patent without evidence of aneurysm, dissection, vasculitis or significant stenosis. Inflow: Patent without evidence of aneurysm, dissection, vasculitis or significant stenosis. Veins: None. Review of the MIP images confirms the above findings. NON-VASCULAR Hepatobiliary: No suspicious cystic or solid hepatic lesions. No intra or extrahepatic biliary ductal dilatation. Small calcified gallstones are noted dependently in the gallbladder. No findings to suggest an acute cholecystitis at this time. Pancreas: No pancreatic mass. No pancreatic ductal dilatation. No pancreatic or peripancreatic fluid collections or inflammatory changes. Spleen: Unremarkable. Adrenals/Urinary Tract: Bilateral kidneys and adrenal glands are normal in appearance. No hydroureteronephrosis. Urinary bladder is normal in appearance. Stomach/Bowel: Intra-abdominal portion of the stomach is unremarkable. No pathologic dilatation of small bowel or colon. Numerous colonic diverticulae are noted, particularly in the sigmoid colon, without surrounding inflammatory changes to suggest an acute diverticulitis at this time. Normal appendix. Lymphatic: No lymphadenopathy noted in the abdomen or pelvis. Reproductive: Status post hysterectomy. Ovaries are not confidently identified may be surgically absent or atrophic. Other: No significant volume of  ascites.  No pneumoperitoneum. Musculoskeletal: There are no aggressive appearing lytic or blastic lesions noted in the visualized portions of the skeleton. Review of the MIP images confirms the above findings. IMPRESSION: 1. No acute abnormality of the  thoracoabdominal aorta to account for the patient's symptoms. No other acute findings are noted elsewhere in the chest, abdomen or pelvis to account for the patient's symptoms. 2. Large hiatal hernia. 3. Aortic atherosclerosis. 4. Cholelithiasis without evidence of acute cholecystitis at this time. 5. Mild colonic diverticulosis without evidence of acute diverticulitis. 6. Additional incidental findings, as above. Electronically Signed   By: Vinnie Langton M.D.   On: 12/12/2019 07:05     ASSESSMENT AND PLAN: Acute chest pain which could be coronary artery disease with first troponin being negative and EKG unremarkable and chest pain-free.  Patient received IV already for CTA of the chest to rule out dissection which was ruled out and would not be a candidate ideally for cardiac catheterization at this time however if the troponin is highly suggestive of acute coronary syndrome with second and third set being abnormal will consider doing catheterization.  We will watch the patient for now.  Termaine Roupp A

## 2019-12-12 NOTE — ED Provider Notes (Signed)
[facility] Emergency Department Provider Note  ____________________________________________   First MD Initiated Contact with Patient 12/12/19 [phone]     (approximate)  I have reviewed the triage vital signs and the nursing notes.   HISTORY  Chief Complaint Chest Pain  Level 5 caveat:  history/ROS limited by acute/critical illness  HPI Claudia Schaefer is a 79 y.o. female with medical history as listed below who presents by private vehicle for evaluation of acute onset and severe chest pain.  She says that it is a crushing pain that is going through to her back.  She also has a lower abdominal pain that has been intermittent but getting worse steadily over the last 1 to 2 weeks.  This is in the setting of being unable to have a bowel movement for the last 2 weeks after a diabetes medication change away from Metformin to another medicine that she cannot remember right now.  She said that she switched herself back to the Metformin because she thought the other medication was keeping her from being able to have a bowel movement but now she has not had any success.  She says that she has passed a very small and hard bits of stool over the last 2 weeks but she has been gradually feeling worse and worse.  Additionally over the last week she has been having intermittent chills and general malaise.  No difficulty breathing, cough, nor sore throat.  She said that tonight she was having difficulty sleeping she got up and at approximately 4:00 in the morning the crushing chest pain began with radiation to her back and down into her abdomen.  Nothing in particular is made it better or worse and is coming in waves.  She says that she has no known history of aortic disease nor of blood clot in the legs or the lungs.  She has received both of her COVID-19 vaccinations and has not been in contact with anyone known to have COVID-19.  She is not on any blood thinners.          Past Medical History:  Diagnosis Date  . CAD (coronary artery disease)   . Cancer ([place])    skin  . Diabetes mellitus without complication ([place])   . Fibromyalgia   . Hypertension   . Osteoarthritis     There are no problems to display for this patient.   Past Surgical History:  Procedure Laterality Date  . ABDOMINAL HYSTERECTOMY    . BREAST BIOPSY Right    neg  . TONSILLECTOMY      Prior to Admission medications   Medication Sig Start Date End Date Taking? Authorizing Provider  atorvastatin (LIPITOR) 40 MG tablet Take 40 mg by mouth daily.    [provider]  diltiazem (CARDIZEM CD) 240 MG 24 hr capsule Take 240 mg by mouth daily.    [provider]  esomeprazole (NEXIUM) 40 MG capsule Take 40 mg by mouth daily at 12 noon.    [provider]  glimepiride (AMARYL) 4 MG tablet Take 4 mg by mouth daily with breakfast.    [provider]  isosorbide mononitrate (IMDUR) 60 MG 24 hr tablet Take 60 mg by mouth daily.    [provider]  metFORMIN (GLUCOPHAGE) 500 MG tablet Take 500 mg by mouth 2 (two) times daily with a meal.    [provider]  predniSONE (DELTASONE) 10 MG tablet Take 3 tablets once a day 04/27/19   Madalyn Rob,  Rhonda L, PA-C  venlafaxine XR (EFFEXOR-XR) 150 MG 24 hr capsule Take 150 mg by mouth daily with breakfast.    [provider]    Allergies Penicillin v potassium, Penicillins, and Sulfa antibiotics  Family History  Problem Relation Age of Onset  . Breast cancer Neg Hx     Social History Social History   Tobacco Use  . Smoking status: Never Smoker  . Smokeless tobacco: Never Used  Substance Use Topics  . Alcohol use: Yes    Comment: rare  . Drug use: No    Review of Systems Constitutional: No fever/chills Eyes: No visual changes. ENT: No sore throat. Cardiovascular: +chest pain. Respiratory: Denies shortness of breath. Gastrointestinal: + Abdominal pain.  About 2 weeks of  constipation.  Some nausea, no vomiting. Genitourinary: Negative for dysuria. Musculoskeletal: Negative for neck pain.  + Back pain. Integumentary: Negative for rash. Neurological: Negative for headaches, focal weakness or numbness.   ____________________________________________   PHYSICAL EXAM:  VITAL SIGNS: ED Triage Vitals  Enc Vitals Group     BP 12/12/19 0549 (!) 233/93     Pulse Rate 12/12/19 0549 78     Resp 12/12/19 0549 19     Temp 12/12/19 0550 97.6 F (36.4 C)     Temp Source 12/12/19 0550 Oral     SpO2 12/12/19 0549 100 %     Weight 12/12/19 0546 56.7 kg (125 lb)     Height 12/12/19 0546 1.499 m (4\' 11" )     Head Circumference --      Peak Flow --      Pain Score 12/12/19 0546 8     Pain Loc --      Pain Edu? --      Excl. in [place]? --     Constitutional: Alert and oriented.  Patient appears to be in substantial distress. Eyes: Conjunctivae are normal.  Head: Atraumatic. Nose: No congestion/rhinnorhea. Mouth/Throat: Patient is wearing a mask. Neck: No stridor.  No meningeal signs.   Cardiovascular: Normal rate, regular rhythm. Good peripheral circulation. Grossly normal heart sounds. Respiratory: Normal respiratory effort.  No retractions. Gastrointestinal: Soft with diffuse tenderness to palpation throughout the abdomen with no focal tenderness and no rebound or guarding.  No localized peritonitis.  No abdominal bruit. Musculoskeletal: No lower extremity tenderness nor edema. No gross deformities of extremities. Neurologic:  Normal speech and language. No gross focal neurologic deficits are appreciated.  Skin:  Skin is warm, dry and intact. Psychiatric: Mood and affect are normal for the circumstances.  ____________________________________________   LABS (all labs ordered are listed, but only abnormal results are displayed)  Labs Reviewed  CBC - Abnormal; Notable for the following components:      Result Value   RBC 3.44 (*)    Hemoglobin 11.8 (*)     HCT 33.2 (*)    MCH 34.3 (*)    All other components within normal limits  MAGNESIUM - Abnormal; Notable for the following components:   Magnesium 2.5 (*)    All other components within normal limits  COMPREHENSIVE METABOLIC PANEL - Abnormal; Notable for the following components:   Glucose, Bld 110 (*)    BUN 31 (*)    GFR calc non Af Amer 55 (*)    All other components within normal limits  RESPIRATORY PANEL BY RT PCR (FLU A&B, COVID)  LIPASE, BLOOD  POC SARS CORONAVIRUS 2 AG  POC SARS CORONAVIRUS 2 AG -  ED  TROPONIN I (HIGH SENSITIVITY)  TROPONIN I (HIGH SENSITIVITY)   ____________________________________________  EKG  ED ECG REPORT I, Hinda Kehr, the attending physician, personally viewed and interpreted this ECG.  Date: 12/12/2019 EKG Time: 5:51 AM Rate: 75 Rhythm: normal sinus rhythm QRS Axis: normal Intervals: Short PR interval at 109 ms ST/T Wave abnormalities: normal Narrative Interpretation: no evidence of acute ischemia  ____________________________________________  RADIOLOGY I, Hinda Kehr, personally viewed and evaluated these images (plain radiographs) as part of my medical decision making, as well as reviewing the written report by the radiologist.  ED MD interpretation:  No acute/emergent abnormalities identified on CTA  Official radiology report(s): CT Angio Chest/Abd/Pel for Dissection W and/or W/WO  Result Date: 12/12/2019 CLINICAL DATA:  79 year old female with history of chest pain. Suspected acute aortic syndrome. EXAM: CT ANGIOGRAPHY CHEST, ABDOMEN AND PELVIS TECHNIQUE: Multidetector CT imaging through the chest, abdomen and pelvis was performed using the standard protocol during bolus administration of intravenous contrast. Multiplanar reconstructed images and MIPs were obtained and reviewed to evaluate the vascular anatomy. CONTRAST:  15mL OMNIPAQUE IOHEXOL 350 MG/ML SOLN COMPARISON:  No prior chest CT. CT the abdomen and pelvis 08/04/2018.  FINDINGS: CTA CHEST FINDINGS Cardiovascular: Precontrast images demonstrate no crescentic high attenuation associated with the wall of the thoracic aorta to suggest acute intramural hemorrhage. Aortic atherosclerosis, without evidence of aneurysm or dissection. Ascending thoracic aorta, mid aortic arch and descending thoracic aorta are normal in caliber measuring 2.6 cm, 2.0 cm and 2.2 cm in diameter respectively. Heart size is normal. There is no significant pericardial fluid, thickening or pericardial calcification. Mediastinum/Nodes: No pathologically enlarged mediastinal or hilar lymph nodes. Large hiatal hernia. No axillary lymphadenopathy. Lungs/Pleura: No pneumothorax. No acute consolidative airspace disease. No pleural effusions. No suspicious appearing pulmonary nodules or masses are noted. Linear areas of architectural distortion are noted in the lower lungs bilaterally, most compatible with mild chronic scarring. Musculoskeletal: There are no aggressive appearing lytic or blastic lesions noted in the visualized portions of the skeleton. Review of the MIP images confirms the above findings. CTA ABDOMEN AND PELVIS FINDINGS VASCULAR Aorta: Aortic atherosclerosis. Normal caliber aorta without aneurysm, dissection, vasculitis or significant stenosis. Celiac: Patent without evidence of aneurysm, dissection, vasculitis or significant stenosis. SMA: Patent without evidence of aneurysm, dissection, vasculitis or significant stenosis. Renals: Both renal arteries are patent without evidence of aneurysm, dissection, vasculitis, fibromuscular dysplasia or significant stenosis. IMA: Patent without evidence of aneurysm, dissection, vasculitis or significant stenosis. Inflow: Patent without evidence of aneurysm, dissection, vasculitis or significant stenosis. Veins: None. Review of the MIP images confirms the above findings. NON-VASCULAR Hepatobiliary: No suspicious cystic or solid hepatic lesions. No intra or  extrahepatic biliary ductal dilatation. Small calcified gallstones are noted dependently in the gallbladder. No findings to suggest an acute cholecystitis at this time. Pancreas: No pancreatic mass. No pancreatic ductal dilatation. No pancreatic or peripancreatic fluid collections or inflammatory changes. Spleen: Unremarkable. Adrenals/Urinary Tract: Bilateral kidneys and adrenal glands are normal in appearance. No hydroureteronephrosis. Urinary bladder is normal in appearance. Stomach/Bowel: Intra-abdominal portion of the stomach is unremarkable. No pathologic dilatation of small bowel or colon. Numerous colonic diverticulae are noted, particularly in the sigmoid colon, without surrounding inflammatory changes to suggest an acute diverticulitis at this time. Normal appendix. Lymphatic: No lymphadenopathy noted in the abdomen or pelvis. Reproductive: Status post hysterectomy. Ovaries are not confidently identified may be surgically absent or atrophic. Other: No significant volume of ascites.  No pneumoperitoneum. Musculoskeletal: There are no aggressive appearing lytic or blastic lesions noted in the visualized  portions of the skeleton. Review of the MIP images confirms the above findings. IMPRESSION: 1. No acute abnormality of the thoracoabdominal aorta to account for the patient's symptoms. No other acute findings are noted elsewhere in the chest, abdomen or pelvis to account for the patient's symptoms. 2. Large hiatal hernia. 3. Aortic atherosclerosis. 4. Cholelithiasis without evidence of acute cholecystitis at this time. 5. Mild colonic diverticulosis without evidence of acute diverticulitis. 6. Additional incidental findings, as above. Electronically Signed   By: Vinnie Langton M.D.   On: 12/12/2019 07:05    ____________________________________________   PROCEDURES   Procedure(s) performed (including Critical Care):  .Critical Care Performed by: Hinda Kehr, MD Authorized by: Hinda Kehr, MD    Critical care provider statement:    Critical care time (minutes):  40   Critical care time was exclusive of:  Separately billable procedures and treating other patients   Critical care was necessary to treat or prevent imminent or life-threatening deterioration of the following conditions:  Circulatory failure   Critical care was time spent personally by me on the following activities:  Development of treatment plan with patient or surrogate, discussions with consultants, evaluation of patient's response to treatment, examination of patient, obtaining history from patient or surrogate, ordering and performing treatments and interventions, ordering and review of laboratory studies, ordering and review of radiographic studies, pulse oximetry, re-evaluation of patient's condition and review of old charts     ____________________________________________   [place] / MDM / [place] / ED COURSE  As part of my medical decision making, I reviewed the following data within the [place] notes reviewed and incorporated, Labs reviewed , EKG interpreted , Old chart reviewed, Patient signed out to Dr. Jari Pigg, and reviewed Notes from prior ED visits   Differential diagnosis includes, but is not limited to, AAS, ACS, constipation, acute intra-abdominal infection including appendicitis or diverticulitis, biliary disease.  The patient's blood pressure is 233/110 while I am at bedside and that is with multiple checks.  She said that her blood pressure usually does not get above 160 and typically she is more in the 120-130 range.  She is not tachycardic.  I am concerned about the possibility of AAS.  The patient is severely hypertensive which is apparently abnormal for her and she is writhing in the bed in pain due to her chest and abdominal pain.  I ordered morphine 4 mg IV which helped somewhat but she remains quite hypertensive and is still gasping from pain.  I  then had the nurse administer Dilaudid 0.5 mg IV with me at the bedside and this provided some relief and she is able to hold still now.  Even after the Dilaudid her blood pressure is still about 199 /110.  I ordered labetalol 20 mg IV to begin treatment before she goes for imaging.  She has a history of chronic kidney disease in the last creatinine on record was 1.2 with a GFR in the high 40s, but I think I have no choice but to proceed with CTA chest/abdomen/pelvis to look for evidence of acute aortic syndrome.  I explained my plan to the patient and acknowledged her chronic kidney disease but explained that I think is very important that we get this imaging and she said that she understood.  Lab work is pending.  Patient is stable for the scanner.      Clinical Course as of Dec 11 721  Fri Dec 12, 2019  0637 COVID  antigen test is negative.  Ordering the 2-hr PCR test given the probability of ICU admission.   [CF]  [id number] CBC reassuring.  Magnesium elevated.  Lipase normal.   [CF]  0703 Reassuring CMP  Comprehensive metabolic panel(!) [CF]  0000000 No acute abnormalities identified on CTA chest/abdomen/pelvis.  I checked with the patient her blood pressure was down to 140/70.  She feels much better at this time.  I updated her about the results and explained my recommendation for her to be admitted to the hospitalist service for continued observation and further management of what appears to be malignant hypertension or hypertensive emergency as the cause of her acute and severe pain.She now feels better and says that she is not sure she wants to stay.  She is concerned about the financial implications and I explained that I have no control over that part of it and cannot say for certain what her insurance will or will not cover.She would like to think about it and check back with Dr. Jari Pigg, who taking over in the emergency department this morning, to see if she wants to go or stay.  I explained to her  that she may have a emergent medical issue that needs further evaluation and the going home could result and recurrent episodes of pain or even death depending on what is going on with her and based on her initial presentation today.  She says that she understands.  I explained that if she does decide to leave she will need to sign out [place] and she says she understands that as well.  She is comfortable with the plan to be observed in the emergency department for another 1 to 2 hours and then reassessed and make her decision about whether or not she will stay or go.In the meantime, Dr. Jari Pigg spoke with one of the hospitalists about the admission when we thought that the patient was definitely going to stay.  I attempted to make contact but was unable to do so.  Dr. Jari Pigg will update the hospitalist(s) about the possible change of plans.    [CF]    Clinical Course User Index [CF] Hinda Kehr, MD     ____________________________________________  FINAL CLINICAL IMPRESSION(S) / ED DIAGNOSES  Final diagnoses:  Malignant hypertension  Chest pain, unspecified type  Generalized abdominal pain  Constipation, unspecified constipation type     MEDICATIONS GIVEN DURING THIS VISIT:  Medications  labetalol (NORMODYNE) 5 MG/ML injection (has no administration in time range)  morphine 4 MG/ML injection 4 mg (4 mg Intravenous Given 12/12/19 0620)  ondansetron (ZOFRAN) injection 4 mg (4 mg Intravenous Given 12/12/19 0618)  HYDROmorphone (DILAUDID) injection 0.5 mg (0.5 mg Intravenous Given 12/12/19 0631)  iohexol (OMNIPAQUE) 350 MG/ML injection 100 mL (100 mLs Intravenous Contrast Given 12/12/19 0634)  labetalol (NORMODYNE) injection 20 mg (20 mg Intravenous Given 12/12/19 [id number])     ED Discharge Orders    None      *Please note:  Claudia Schaefer was evaluated in Emergency Department on 12/12/2019 for the symptoms described in the history of present illness. She was evaluated in the  context of the global COVID-19 pandemic, which necessitated consideration that the patient might be at risk for infection with the SARS-CoV-2 virus that causes COVID-19. Institutional protocols and algorithms that pertain to the evaluation of patients at risk for COVID-19 are in a state of rapid change based on information released by regulatory bodies including the CDC and federal and state organizations.  These policies and algorithms were followed during the patient's care in the ED.  Some ED evaluations and interventions may be delayed as a result of limited staffing during the pandemic.*  Note:  This document was prepared using Dragon voice recognition software and may include unintentional dictation errors.   Hinda Kehr, MD 12/12/19 [phone]

## 2019-12-12 NOTE — ED Notes (Signed)
ED MD at bedside and told this RN to give labatol before CT scan

## 2019-12-12 NOTE — ED Triage Notes (Signed)
Patient c/o medial chest pain described as pressure radiating to stomach and back. Patient reports accompanying symptoms of SOB, nausea, and weakness.

## 2019-12-12 NOTE — ED Notes (Addendum)
Imaging staff remains at bedside.

## 2019-12-12 NOTE — ED Notes (Signed)
EKG completed as pt reported sudden "heaviness" at medial/upper chest which she reported radiating into her back. Skin dry. Resp reg/unlabored. NSR. EKG to Jeb NT who is currently taking it to an EDP.

## 2019-12-12 NOTE — ED Provider Notes (Signed)
7:08 AM Assumed care for off going team.   Blood pressure (!) 233/93, pulse 78, temperature 97.6 F (36.4 C), temperature source Oral, resp. rate 19, height 4\' 11"  (1.499 m), weight 56.7 kg, SpO2 100 %.  See their HPI for full report but in brief    Not feeling well x1 week, chills, 2 weeks constipation with adjustments of diabetes meds. Tonight at 4AM acute chest pain and abdominal pain. HTN 233 and clutching chest. 4 morphine and didn't change. Took CT directly. Dilaudid but pressures 200/100--> gave her 20 [id number] systolic. Looking better now.  Admit HTN emergency/Chest pain admit.   CT no dissection--Discussed with hospital to be admitted.       Vanessa [place], MD 12/12/19 [phone]

## 2019-12-12 NOTE — H&P (Signed)
History and Physical    Claudia HENNINGSON Schaefer:[id number] DOB: [birth year] DOA: 12/12/2019  PCP: Patient, Claudia Schaefer   Patient coming from: Home  I have personally briefly reviewed patient's old medical records in [facility]  Chief Complaint: Chest pain  HPI: Claudia Schaefer is a 79 y.o. female with medical history significant for diabetes mellitus, hypertension, coronary artery disease and fibromyalgia.  She presents to the emergency room by private vehicle for evaluation of sudden onset chest pain which woke her up from sleep. She described pain as severe pain and rated it 9 out of 10 in intensity at its worst with radiation to the back. There was Claudia known relieving or aggravating factors. She also complained of lower abdominal pain that has been intermittent but getting worse steadily over the last 1 week.  Abdominal pain was associated with constipation following a change in her diabetic medications. Upon arrival to the emergency room she had significantly elevated blood pressure with SBP OF .  CT angiogram of the abdomen and pelvis to rule out dissection which came back negative. She received a dose of IV labetalol in the ER with improvement in her blood pressure.  She received IV morphine and Dilaudid with improvement in her chest pain She denies having any nausea, vomiting, headache, dizziness or lightheadedness, urinary frequency, nocturia or dysuria.  Denies having any fever, chills or cough She has completed her COVID-19 vaccination series  ED Course:  Patient presented to the ER for evaluation of sudden onset chest pain and was noted to have blood pressure of 233/110.  Due to  significantly elevated blood pressure and chest pain radiating to the back there was a concern about the possibility of AAS.  She had a CT angiogram of the abdomen and pelvis which was negative for aortic dissection.  She received a dose of IV labetalol in the ER with improvement in her blood  pressure.  She will be admitted to the hospital for further evaluation  Review of Systems: As Schaefer HPI otherwise 10 point review of systems negative.    Past Medical History:  Diagnosis Date  . CAD (coronary artery disease)   . Cancer (HCC)    skin  . Diabetes mellitus without complication (HCC)   . Fibromyalgia   . Hypertension   . Osteoarthritis     Past Surgical History:  Procedure Laterality Date  . ABDOMINAL HYSTERECTOMY    . BREAST BIOPSY Right    neg  . TONSILLECTOMY       reports that she has never smoked. She has never used smokeless tobacco. She reports current alcohol use. She reports that she does not use drugs.  Allergies  Allergen Reactions  . Penicillin V Potassium     Other reaction(s): Unknown  . Penicillins Rash    Did it involve swelling of the face/tongue/throat, SOB, or low BP? Claudia Did it involve sudden or severe rash/hives, skin peeling, or any reaction on the inside of your mouth or nose? Yes Did you need to seek medical attention at a hospital or doctor's office? Claudia When did it last happen? If all above answers are "Claudia", may proceed with cephalosporin use.  . Sulfa Antibiotics Rash and Other (See Comments)    Family History  Problem Relation Age of Onset  . Breast cancer Neg Hx      Prior to Admission medications   Medication Sig Start Date End Date Taking? Authorizing Provider  atorvastatin (LIPITOR) 40 MG tablet Take 40  mg by mouth daily.    [provider]  diltiazem (CARDIZEM CD) 240 MG 24 hr capsule Take 240 mg by mouth daily.    [provider]  esomeprazole (NEXIUM) 40 MG capsule Take 40 mg by mouth daily at 12 noon.    [provider]  glimepiride (AMARYL) 4 MG tablet Take 4 mg by mouth daily with breakfast.    [provider]  isosorbide mononitrate (IMDUR) 60 MG 24 hr tablet Take 60 mg by mouth daily.    [provider]  metFORMIN (GLUCOPHAGE) 500 MG tablet Take 500 mg by mouth 2  (two) times daily with a meal.    [provider]  predniSONE (DELTASONE) 10 MG tablet Take 3 tablets once a day 04/27/19   Tommi Rumps, PA-C  venlafaxine XR (EFFEXOR-XR) 150 MG 24 hr capsule Take 150 mg by mouth daily with breakfast.    [provider]    Physical Exam: Vitals:   12/12/19 0710 12/12/19 0720 12/12/19 0800 12/12/19 0820  BP: 140/70 (!) 166/80 (!) 161/81 140/70  Pulse: 67 76 72 65  Resp: 14 13 15 14   Temp:      TempSrc:      SpO2: 94% 94% 98% 95%  Weight:      Height:         Vitals:   12/12/19 0710 12/12/19 0720 12/12/19 0800 12/12/19 0820  BP: 140/70 (!) 166/80 (!) 161/81 140/70  Pulse: 67 76 72 65  Resp: 14 13 15 14   Temp:      TempSrc:      SpO2: 94% 94% 98% 95%  Weight:      Height:        Constitutional: NAD, alert and oriented x 3 Eyes: PERRL, lids and conjunctivae normal ENMT: Mucous membranes are moist.  Neck: normal, supple, Claudia masses, Claudia thyromegaly Respiratory: clear to auscultation bilaterally, Claudia wheezing, Claudia crackles. Normal respiratory effort. Claudia accessory muscle use.  Cardiovascular: Regular rate and rhythm,  Claudia murmurs / rubs / gallops. Claudia extremity edema. 2+ pedal pulses. Claudia carotid bruits.  Abdomen: Claudia tenderness, Claudia masses palpated. Claudia hepatosplenomegaly. Bowel sounds positive.  Musculoskeletal: Claudia clubbing / cyanosis. Claudia joint deformity upper and lower extremities.  Skin: Claudia rashes, lesions, ulcers.  Neurologic: Claudia gross focal neurologic deficit. Psychiatric: Normal mood and affect.   Labs on Admission: I have personally reviewed following labs and imaging studies  CBC: Recent Labs  Lab 12/12/19 0602  WBC 6.1  HGB 11.8*  HCT 33.2*  MCV 96.5  PLT 221   Basic Metabolic Panel: Recent Labs  Lab 12/12/19 0602 12/12/19 0604  NA  --  142  K  --  4.3  CL  --  108  CO2  --  27  GLUCOSE  --  110*  BUN  --  31*  CREATININE  --  1.00  CALCIUM  --  9.4  MG 2.5*  --    GFR: Estimated Creatinine  Clearance: 36.7 mL/min (by C-G formula based on SCr of 1 mg/dL). Liver Function Tests: Recent Labs  Lab 12/12/19 0604  AST 23  ALT 22  ALKPHOS 64  BILITOT 0.5  PROT 7.4  ALBUMIN 3.9   Recent Labs  Lab 12/12/19 0602  LIPASE 49   Claudia results for input(s): AMMONIA in the last 168 hours. Coagulation Profile: Claudia results for input(s): INR, PROTIME in the last 168 hours. Cardiac Enzymes: Claudia results for input(s): CKTOTAL, CKMB, CKMBINDEX, TROPONINI in the last 168  hours. BNP (last 3 results) Claudia results for input(s): PROBNP in the last 8760 hours. HbA1C: Claudia results for input(s): HGBA1C in the last 72 hours. CBG: Claudia results for input(s): GLUCAP in the last 168 hours. Lipid Profile: Claudia results for input(s): CHOL, HDL, LDLCALC, TRIG, CHOLHDL, LDLDIRECT in the last 72 hours. Thyroid Function Tests: Claudia results for input(s): TSH, T4TOTAL, FREET4, T3FREE, THYROIDAB in the last 72 hours. Anemia Panel: Claudia results for input(s): VITAMINB12, FOLATE, FERRITIN, TIBC, IRON, RETICCTPCT in the last 72 hours. Urine analysis: Claudia results found for: COLORURINE, APPEARANCEUR, LABSPEC, PHURINE, GLUCOSEU, HGBUR, BILIRUBINUR, KETONESUR, PROTEINUR, UROBILINOGEN, NITRITE, LEUKOCYTESUR  Radiological Exams on Admission: CT Angio Chest/Abd/Pel for Dissection W and/or W/WO  Result Date: 12/12/2019 CLINICAL DATA:  79 year old female with history of chest pain. Suspected acute aortic syndrome. EXAM: CT ANGIOGRAPHY CHEST, ABDOMEN AND PELVIS TECHNIQUE: Multidetector CT imaging through the chest, abdomen and pelvis was performed using the standard protocol during bolus administration of intravenous contrast. Multiplanar reconstructed images and MIPs were obtained and reviewed to evaluate the vascular anatomy. CONTRAST:  OMNIPAQUE IOHEXOL 350 MG/ML SOLN COMPARISON:  Claudia prior chest CT. CT the abdomen and pelvis 08/04/2018. FINDINGS: CTA CHEST FINDINGS Cardiovascular: Precontrast images demonstrate Claudia crescentic high  attenuation associated with the wall of the thoracic aorta to suggest acute intramural hemorrhage. Aortic atherosclerosis, without evidence of aneurysm or dissection. Ascending thoracic aorta, mid aortic arch and descending thoracic aorta are normal in caliber measuring 2.6 cm, 2.0 cm and 2.2 cm in diameter respectively. Heart size is normal. There is Claudia significant pericardial fluid, thickening or pericardial calcification. Mediastinum/Nodes: Claudia pathologically enlarged mediastinal or hilar lymph nodes. Large hiatal hernia. Claudia axillary lymphadenopathy. Lungs/Pleura: Claudia pneumothorax. Claudia acute consolidative airspace disease. Claudia pleural effusions. Claudia suspicious appearing pulmonary nodules or masses are noted. Linear areas of architectural distortion are noted in the lower lungs bilaterally, most compatible with mild chronic scarring. Musculoskeletal: There are Claudia aggressive appearing lytic or blastic lesions noted in the visualized portions of the skeleton. Review of the MIP images confirms the above findings. CTA ABDOMEN AND PELVIS FINDINGS VASCULAR Aorta: Aortic atherosclerosis. Normal caliber aorta without aneurysm, dissection, vasculitis or significant stenosis. Celiac: Patent without evidence of aneurysm, dissection, vasculitis or significant stenosis. SMA: Patent without evidence of aneurysm, dissection, vasculitis or significant stenosis. Renals: Both renal arteries are patent without evidence of aneurysm, dissection, vasculitis, fibromuscular dysplasia or significant stenosis. IMA: Patent without evidence of aneurysm, dissection, vasculitis or significant stenosis. Inflow: Patent without evidence of aneurysm, dissection, vasculitis or significant stenosis. Veins: None. Review of the MIP images confirms the above findings. NON-VASCULAR Hepatobiliary: Claudia suspicious cystic or solid hepatic lesions. Claudia intra or extrahepatic biliary ductal dilatation. Small calcified gallstones are noted dependently in the  gallbladder. Claudia findings to suggest an acute cholecystitis at this time. Pancreas: Claudia pancreatic mass. Claudia pancreatic ductal dilatation. Claudia pancreatic or peripancreatic fluid collections or inflammatory changes. Spleen: Unremarkable. Adrenals/Urinary Tract: Bilateral kidneys and adrenal glands are normal in appearance. Claudia hydroureteronephrosis. Urinary bladder is normal in appearance. Stomach/Bowel: Intra-abdominal portion of the stomach is unremarkable. Claudia pathologic dilatation of small bowel or colon. Numerous colonic diverticulae are noted, particularly in the sigmoid colon, without surrounding inflammatory changes to suggest an acute diverticulitis at this time. Normal appendix. Lymphatic: Claudia lymphadenopathy noted in the abdomen or pelvis. Reproductive: Status post hysterectomy. Ovaries are not confidently identified may be surgically absent or atrophic. Other: Claudia significant volume of ascites.  Claudia pneumoperitoneum. Musculoskeletal: There are Claudia aggressive appearing lytic or blastic  lesions noted in the visualized portions of the skeleton. Review of the MIP images confirms the above findings. IMPRESSION: 1. Claudia acute abnormality of the thoracoabdominal aorta to account for the patient's symptoms. Claudia other acute findings are noted elsewhere in the chest, abdomen or pelvis to account for the patient's symptoms. 2. Large hiatal hernia. 3. Aortic atherosclerosis. 4. Cholelithiasis without evidence of acute cholecystitis at this time. 5. Mild colonic diverticulosis without evidence of acute diverticulitis. 6. Additional incidental findings, as above. Electronically Signed   By: Trudie Reed M.D.   On: 12/12/2019 07:05    EKG: Independently reviewed.  Sinus rhythm  Assessment/Plan Principal Problem:   Chest pain at rest Active Problems:   Type 2 diabetes mellitus with stage 3 chronic kidney disease (HCC)   Hypertensive urgency   GERD (gastroesophageal reflux disease)    Chest pain Patient presents  for evaluation of sudden onset chest pain which woke her up out of her sleep. She has significant cardiac risk factors include diabetes mellitus, hypertension and known history of coronary artery disease. Will obtain serial cardiac enzymes to rule out an acute coronary syndrome Obtain 2D echocardiogram Continue aspirin, nitrates and statins She will need a stress test for further risk factor stratification Consult cardiology   Hypertensive urgency Optimize blood pressure control Patient received a dose of labetalol in the ER with improvement in her blood pressure Continue nitrates and Cardizem   Diabetes mellitus with complications of stage III chronic kidney disease Hold oral hypoglycemic agents while hospitalized Maintain consistent carbohydrate diet Sliding scale coverage She will need referral to nephrology as an outpatient   GERD Continue Protonix    DVT prophylaxis: Lovenox Code Status: Full Family Communication: Plan of care was discussed with patient at the bedside.  She verbalizes understanding and agrees with the plan Disposition Plan: Back to previous home environment Consults called: Cardiology    Allecia Bells MD [facility]     12/12/2019, 9:11 AM

## 2019-12-12 NOTE — ED Notes (Signed)
Pt given dinner tray.

## 2019-12-13 ENCOUNTER — Other Ambulatory Visit: Payer: Self-pay

## 2019-12-13 DIAGNOSIS — E1122 Type 2 diabetes mellitus with diabetic chronic kidney disease: Secondary | ICD-10-CM | POA: Diagnosis not present

## 2019-12-13 DIAGNOSIS — N183 Chronic kidney disease, stage 3 unspecified: Secondary | ICD-10-CM

## 2019-12-13 DIAGNOSIS — K219 Gastro-esophageal reflux disease without esophagitis: Secondary | ICD-10-CM | POA: Diagnosis not present

## 2019-12-13 DIAGNOSIS — I160 Hypertensive urgency: Secondary | ICD-10-CM | POA: Diagnosis not present

## 2019-12-13 DIAGNOSIS — R079 Chest pain, unspecified: Secondary | ICD-10-CM | POA: Diagnosis not present

## 2019-12-13 LAB — GLUCOSE, CAPILLARY: Glucose-Capillary: 105 mg/dL — ABNORMAL HIGH (ref 70–99)

## 2019-12-13 LAB — ECHOCARDIOGRAM COMPLETE
Height: 59 in
Weight: 2000 oz

## 2019-12-13 MED ORDER — BISACODYL 10 MG RE SUPP
10.0000 mg | Freq: Every day | RECTAL | Status: DC | PRN
  Administered 2019-12-13: 10 mg via RECTAL
  Filled 2019-12-13: qty 1

## 2019-12-13 MED ORDER — SENNA 8.6 MG PO TABS: 2.0000 | ORAL_TABLET | Freq: Two times a day (BID) | ORAL | Status: DC

## 2019-12-13 NOTE — Progress Notes (Signed)
Discharge instructions explained to pt/ verbalized an understanding/ iv and tele  Removed/ transported off unit via wheelchair.

## 2019-12-13 NOTE — Plan of Care (Signed)

## 2019-12-13 NOTE — Progress Notes (Signed)
SUBJECTIVE: Patient denies any further chest pain   Vitals:   12/13/19 0457 12/13/19 0458 12/13/19 0532 12/13/19 0804  BP: (!) 171/73 (!) 170/79 (!) 157/70 (!) 159/58  Pulse: 73 76 76 74  Resp:    19  Temp: 97.8 F (36.6 C)   98.2 F (36.8 C)  TempSrc: Oral   Oral  SpO2: 98% 97% 96% 94%  Weight: 59.5 kg     Height:        Intake/Output Summary (Last 24 hours) at 12/13/2019 0844 Last data filed at 12/13/2019 0501 Gross per 24 hour  Intake --  Output 600 ml  Net -600 ml    LABS: Basic Metabolic Panel: Recent Labs    12/12/19 0602 12/12/19 0604  NA  --  142  K  --  4.3  CL  --  108  CO2  --  27  GLUCOSE  --  110*  BUN  --  31*  CREATININE  --  1.00  CALCIUM  --  9.4  MG 2.5*  --    Liver Function Tests: Recent Labs    12/12/19 0604  AST 23  ALT 22  ALKPHOS 64  BILITOT 0.5  PROT 7.4  ALBUMIN 3.9   Recent Labs    12/12/19 0602  LIPASE 49   CBC: Recent Labs    12/12/19 0602  WBC 6.1  HGB 11.8*  HCT 33.2*  MCV 96.5  PLT 221   Cardiac Enzymes: No results for input(s): CKTOTAL, CKMB, CKMBINDEX, TROPONINI in the last 72 hours. BNP: Invalid input(s): POCBNP D-Dimer: No results for input(s): DDIMER in the last 72 hours. Hemoglobin A1C: Recent Labs    12/12/19 0602  HGBA1C 7.2*   Fasting Lipid Panel: No results for input(s): CHOL, HDL, LDLCALC, TRIG, CHOLHDL, LDLDIRECT in the last 72 hours. Thyroid Function Tests: No results for input(s): TSH, T4TOTAL, T3FREE, THYROIDAB in the last 72 hours.  Invalid input(s): FREET3 Anemia Panel: No results for input(s): VITAMINB12, FOLATE, FERRITIN, TIBC, IRON, RETICCTPCT in the last 72 hours.   PHYSICAL EXAM General: Well developed, well nourished, in no acute distress HEENT:  Normocephalic and atramatic Neck:  No JVD.  Lungs: Clear bilaterally to auscultation and percussion. Heart: HRRR . Normal S1 and S2 without gallops or murmurs.  Abdomen: Bowel sounds are positive, abdomen soft and non-tender   Msk:  Back normal, normal gait. Normal strength and tone for age. Extremities: No clubbing, cyanosis or edema.   Neuro: Alert and oriented X 3. Psych:  Good affect, responds appropriately  TELEMETRY: Sinus rhythm  ASSESSMENT AND PLAN: Chest pain atypical and ruled out for myocardial infarction and has no further chest pain.  Patient can be discharged with follow-up in the office Tuesday at 845 in my office.  Principal Problem:   Chest pain at rest Active Problems:   Type 2 diabetes mellitus with stage 3 chronic kidney disease (HCC)   Hypertensive urgency   GERD (gastroesophageal reflux disease)    Claudia David, MD, [facility] 12/13/2019 8:44 AM

## 2019-12-13 NOTE — Discharge Summary (Signed)
Physician Discharge Summary  VALMA METZGER [id number] DOB: 1941-01-24 DOA: 12/12/2019  PCP: Patient, No Pcp Per  Admit date: 12/12/2019 Discharge date: 12/13/2019  Admitted From: Home Disposition: Home   Recommendations for Outpatient Follow-up:  1. Follow up with cardiology as scheduled 4/6 2. Follow up with PCP for diabetes management 3. Follow up with GI for further evaluation and management of several GI complaints, most recently constipation  Home Health: None Equipment/Devices: None Discharge Condition: Stable CODE STATUS: Full Diet recommendation: Heart healthy, carb-modified  Brief/Interim Summary: Per HPI:  Claudia Schaefer is a 79 y.o. female with medical history significant for diabetes mellitus, hypertension, coronary artery disease and fibromyalgia.  She presents to the emergency room by private vehicle for evaluation of sudden onset chest pain which woke her up from sleep. She described pain as severe pain and rated it 9 out of 10 in intensity at its worst with radiation to the back. There was no known relieving or aggravating factors. She also complained of lower abdominal pain that has been intermittent but getting worse steadily over the last 1 week.  Abdominal pain was associated with constipation following a change in her diabetic medications. Upon arrival to the emergency room she had significantly elevated blood pressure with SBP OF 213mmHg.  CT angiogram of the abdomen and pelvis to rule out dissection which came back negative. She received a dose of IV labetalol in the ER with improvement in her blood pressure.  She received IV morphine and Dilaudid with improvement in her chest pain She denies having any nausea, vomiting, headache, dizziness or lightheadedness, urinary frequency, nocturia or dysuria.  Denies having any fever, chills or cough She has completed her COVID-19 vaccination series  ED Course:  Patient presented to the ER for evaluation of sudden  onset chest pain and was noted to have blood pressure of 233/110.  Due to  significantly elevated blood pressure and chest pain radiating to the back there was a concern about the possibility of AAS. She had a CT angiogram of the abdomen and pelvis which was negative for aortic dissection.  She received a dose of IV labetalol in the ER with improvement in her blood pressure.  She will be admitted to the hospital for further evaluation  Hospital Course: Troponins remained negative, no ischemic evaluation recommended by cardiology. Chest pain has resolved. Patient is discharged in stable condition with follow up scheduled with cardiology this coming week.  Discharge Diagnoses:  Principal Problem:   Chest pain at rest Active Problems:   Type 2 diabetes mellitus with stage 3 chronic kidney disease (HCC)   Hypertensive urgency   GERD (gastroesophageal reflux disease)  Chest pain: Atypical. Troponins negative. CP resolved. No WMA on echo. ECG not ischemic overtly. CT C/A/P revealed no acute abnormality of the thoracoabdominal aorta to account for the patient's symptoms. No other acute findings are noted elsewhere in the chest, abdomen or pelvis to account for the patient's symptoms. - Cardiology consulted, recommended discharge, has follow up scheduled. No medication changes recommended.   Hypertensive urgency: Improved.  - Continue home medications, check BP at follow up and continue outpatient management.   IBS, constipation: No obstruction on CT, abd benign, Healthy bowel sounds, pt tolerating po. She's nervous about discharge before having BM. Enema was offered, though pt prefers to manage this at home. There's no current abdominal pain.   Diabetes mellitus with complications of stage III chronic kidney disease - Continue home medications with which she is intermittently adherent. -  Consider referral to nephrology as an outpatient  GERD - Continue PPI  Discharge Instructions Discharge  Instructions    Diet - low sodium heart healthy   Complete by: As directed    Discharge instructions   Complete by: As directed    You were admitted for atypical chest pain which does not seem to be due to an immediately life threatening cause. There is no evidence of blood clots or aneurysms or heart attack or bowel obstruction or infection. You should follow up with cardiology as scheduled and with your PCP as soon as possible for further recommendations. Seek medical attention if your symptoms worsen.   Increase activity slowly   Complete by: As directed      Allergies as of 12/13/2019      Reactions   Penicillin V Potassium    Other reaction(s): Unknown   Penicillins Rash   Did it involve swelling of the face/tongue/throat, SOB, or low BP? No Did it involve sudden or severe rash/hives, skin peeling, or any reaction on the inside of your mouth or nose? Yes Did you need to seek medical attention at a hospital or doctor's office? No When did it last happen? If all above answers are "NO", may proceed with cephalosporin use.   Sulfa Antibiotics Rash, Other (See Comments)      Medication List    STOP taking these medications   predniSONE 10 MG tablet Commonly known as: DELTASONE     TAKE these medications   atorvastatin 40 MG tablet Commonly known as: LIPITOR Take 40 mg by mouth daily.   glipiZIDE 10 MG 24 hr tablet Commonly known as: GLUCOTROL XL Take 10 mg by mouth daily.   isosorbide mononitrate 120 MG 24 hr tablet Commonly known as: IMDUR Take 120 mg by mouth daily.   losartan 50 MG tablet Commonly known as: COZAAR Take 50 mg by mouth daily.   metFORMIN 500 MG tablet Commonly known as: GLUCOPHAGE Take 500 mg by mouth 2 (two) times daily with a meal.   pioglitazone 30 MG tablet Commonly known as: ACTOS Take 30 mg by mouth daily.   traMADol 50 MG tablet Commonly known as: ULTRAM Take by mouth at bedtime as needed for moderate pain.      Follow-up  Information    Dionisio David, MD Follow up.   Specialty: Cardiology Contact information: [place] [address] [phone]          Allergies  Allergen Reactions  . Penicillin V Potassium     Other reaction(s): Unknown  . Penicillins Rash    Did it involve swelling of the face/tongue/throat, SOB, or low BP? No Did it involve sudden or severe rash/hives, skin peeling, or any reaction on the inside of your mouth or nose? Yes Did you need to seek medical attention at a hospital or doctor's office? No When did it last happen? If all above answers are "NO", may proceed with cephalosporin use.  . Sulfa Antibiotics Rash and Other (See Comments)    Consultations:  Cardiology  Procedures/Studies: ECHOCARDIOGRAM COMPLETE  Result Date: 12/13/2019    ECHOCARDIOGRAM REPORT   Patient Name:   DIAMANTINA MELILLO Date of Exam: 12/12/2019 Medical Rec #:  [id number]          Height:       59.0 in Accession #:    [id number]         Weight:       125.0 lb Date of Birth:  01/01/1941  BSA:          1.510 m Patient Age:    24 years           BP:           142/68 mmHg Patient Gender: F                  HR:           76 bpm. Exam Location:  [facility] Procedure: 2D Echo, Cardiac Doppler and Color Doppler Indications:     Chest Pain 786.50 / R07.9  History:         Patient has no prior history of Echocardiogram examinations.                  CAD; Risk Factors:Hypertension.  Sonographer:     Alyse Low Roar Referring Phys:  Amanda Park Diagnosing Phys: Neoma Laming MD IMPRESSIONS  1. Left ventricular ejection fraction, by estimation, is 60 to 65%. The left ventricle has normal function. The left ventricle has no regional wall motion abnormalities. Left ventricular diastolic parameters are consistent with Grade I diastolic dysfunction (impaired relaxation).  2. Right ventricular systolic function is normal. The right ventricular size is normal. There is normal pulmonary artery  systolic pressure.  3. The mitral valve is normal in structure. Mild mitral valve regurgitation. No evidence of mitral stenosis.  4. The aortic valve is normal in structure. Aortic valve regurgitation is not visualized. Mild aortic valve sclerosis is present, with no evidence of aortic valve stenosis.  5. The inferior vena cava is normal in size with greater than 50% respiratory variability, suggesting right atrial pressure of 3 mmHg. FINDINGS  Left Ventricle: Left ventricular ejection fraction, by estimation, is 60 to 65%. The left ventricle has normal function. The left ventricle has no regional wall motion abnormalities. The left ventricular internal cavity size was normal in size. There is  no left ventricular hypertrophy. Left ventricular diastolic parameters are consistent with Grade I diastolic dysfunction (impaired relaxation). Right Ventricle: The right ventricular size is normal. No increase in right ventricular wall thickness. Right ventricular systolic function is normal. There is normal pulmonary artery systolic pressure. The tricuspid regurgitant velocity is 2.16 m/s, and  with an assumed right atrial pressure of 10 mmHg, the estimated right ventricular systolic pressure is 0000000 mmHg. Left Atrium: Left atrial size was normal in size. Right Atrium: Right atrial size was normal in size. Pericardium: There is no evidence of pericardial effusion. Mitral Valve: The mitral valve is normal in structure. Normal mobility of the mitral valve leaflets. Mild mitral valve regurgitation. No evidence of mitral valve stenosis. Tricuspid Valve: The tricuspid valve is normal in structure. Tricuspid valve regurgitation is mild . No evidence of tricuspid stenosis. Aortic Valve: The aortic valve is normal in structure. Aortic valve regurgitation is not visualized. Mild aortic valve sclerosis is present, with no evidence of aortic valve stenosis. Aortic valve mean gradient measures 4.0 mmHg. Aortic valve peak gradient  measures 8.9 mmHg. Aortic valve area, by VTI measures 1.89 cm. Pulmonic Valve: The pulmonic valve was normal in structure. Pulmonic valve regurgitation is trivial. No evidence of pulmonic stenosis. Aorta: The aortic root is normal in size and structure. Venous: The inferior vena cava is normal in size with greater than 50% respiratory variability, suggesting right atrial pressure of 3 mmHg. IAS/Shunts: No atrial level shunt detected by color flow Doppler.  LEFT VENTRICLE PLAX 2D LVIDd:         4.42 cm  Diastology LVIDs:         2.92 cm  LV e' lateral:   7.72 cm/s LV PW:         1.14 cm  LV E/e' lateral: 13.3 LV IVS:        1.30 cm  LV e' medial:    7.83 cm/s LVOT diam:     1.70 cm  LV E/e' medial:  13.2 LV SV:         53 LV SV Index:   35 LVOT Area:     2.27 cm  RIGHT VENTRICLE RV Mid diam:    2.51 cm RV S prime:     15.90 cm/s LEFT ATRIUM             Index       RIGHT ATRIUM           Index LA diam:        3.80 cm 2.52 cm/m  RA Area:     12.40 cm LA Vol (A2C):   44.6 ml 29.53 ml/m RA Volume:   29.90 ml  19.80 ml/m LA Vol (A4C):   39.5 ml 26.15 ml/m LA Biplane Vol: 42.5 ml 28.14 ml/m  AORTIC VALVE                   PULMONIC VALVE AV Area (Vmax):    1.66 cm    PV Vmax:        0.90 m/s AV Area (Vmean):   1.85 cm    PV Peak grad:   3.2 mmHg AV Area (VTI):     1.89 cm    RVOT Peak grad: 2 mmHg AV Vmax:           149.00 cm/s AV Vmean:          92.000 cm/s AV VTI:            0.279 m AV Peak Grad:      8.9 mmHg AV Mean Grad:      4.0 mmHg LVOT Vmax:         109.00 cm/s LVOT Vmean:        74.900 cm/s LVOT VTI:          0.232 m LVOT/AV VTI ratio: 0.83  AORTA Ao Root diam: 2.50 cm MITRAL VALVE                TRICUSPID VALVE MV Area (PHT): 3.89 cm     TR Peak grad:   18.7 mmHg MV Decel Time: 195 msec     TR Vmax:        216.00 cm/s MV E velocity: 103.00 cm/s MV A velocity: 82.90 cm/s   SHUNTS MV E/A ratio:  1.24         Systemic VTI:  0.23 m                             Systemic Diam: 1.70 cm Neoma Laming MD  Electronically signed by Neoma Laming MD Signature Date/Time: 12/13/2019/8:32:50 AM    Final    CT Angio Chest/Abd/Pel for Dissection W and/or W/WO  Result Date: 12/12/2019 CLINICAL DATA:  79 year old female with history of chest pain. Suspected acute aortic syndrome. EXAM: CT ANGIOGRAPHY CHEST, ABDOMEN AND PELVIS TECHNIQUE: Multidetector CT imaging through the chest, abdomen and pelvis was performed using the standard protocol during bolus administration of intravenous contrast. Multiplanar reconstructed images and MIPs were obtained and reviewed to evaluate the  vascular anatomy. CONTRAST:  169mL OMNIPAQUE IOHEXOL 350 MG/ML SOLN COMPARISON:  No prior chest CT. CT the abdomen and pelvis 08/04/2018. FINDINGS: CTA CHEST FINDINGS Cardiovascular: Precontrast images demonstrate no crescentic high attenuation associated with the wall of the thoracic aorta to suggest acute intramural hemorrhage. Aortic atherosclerosis, without evidence of aneurysm or dissection. Ascending thoracic aorta, mid aortic arch and descending thoracic aorta are normal in caliber measuring 2.6 cm, 2.0 cm and 2.2 cm in diameter respectively. Heart size is normal. There is no significant pericardial fluid, thickening or pericardial calcification. Mediastinum/Nodes: No pathologically enlarged mediastinal or hilar lymph nodes. Large hiatal hernia. No axillary lymphadenopathy. Lungs/Pleura: No pneumothorax. No acute consolidative airspace disease. No pleural effusions. No suspicious appearing pulmonary nodules or masses are noted. Linear areas of architectural distortion are noted in the lower lungs bilaterally, most compatible with mild chronic scarring. Musculoskeletal: There are no aggressive appearing lytic or blastic lesions noted in the visualized portions of the skeleton. Review of the MIP images confirms the above findings. CTA ABDOMEN AND PELVIS FINDINGS VASCULAR Aorta: Aortic atherosclerosis. Normal caliber aorta without aneurysm, dissection,  vasculitis or significant stenosis. Celiac: Patent without evidence of aneurysm, dissection, vasculitis or significant stenosis. SMA: Patent without evidence of aneurysm, dissection, vasculitis or significant stenosis. Renals: Both renal arteries are patent without evidence of aneurysm, dissection, vasculitis, fibromuscular dysplasia or significant stenosis. IMA: Patent without evidence of aneurysm, dissection, vasculitis or significant stenosis. Inflow: Patent without evidence of aneurysm, dissection, vasculitis or significant stenosis. Veins: None. Review of the MIP images confirms the above findings. NON-VASCULAR Hepatobiliary: No suspicious cystic or solid hepatic lesions. No intra or extrahepatic biliary ductal dilatation. Small calcified gallstones are noted dependently in the gallbladder. No findings to suggest an acute cholecystitis at this time. Pancreas: No pancreatic mass. No pancreatic ductal dilatation. No pancreatic or peripancreatic fluid collections or inflammatory changes. Spleen: Unremarkable. Adrenals/Urinary Tract: Bilateral kidneys and adrenal glands are normal in appearance. No hydroureteronephrosis. Urinary bladder is normal in appearance. Stomach/Bowel: Intra-abdominal portion of the stomach is unremarkable. No pathologic dilatation of small bowel or colon. Numerous colonic diverticulae are noted, particularly in the sigmoid colon, without surrounding inflammatory changes to suggest an acute diverticulitis at this time. Normal appendix. Lymphatic: No lymphadenopathy noted in the abdomen or pelvis. Reproductive: Status post hysterectomy. Ovaries are not confidently identified may be surgically absent or atrophic. Other: No significant volume of ascites.  No pneumoperitoneum. Musculoskeletal: There are no aggressive appearing lytic or blastic lesions noted in the visualized portions of the skeleton. Review of the MIP images confirms the above findings. IMPRESSION: 1. No acute abnormality of the  thoracoabdominal aorta to account for the patient's symptoms. No other acute findings are noted elsewhere in the chest, abdomen or pelvis to account for the patient's symptoms. 2. Large hiatal hernia. 3. Aortic atherosclerosis. 4. Cholelithiasis without evidence of acute cholecystitis at this time. 5. Mild colonic diverticulosis without evidence of acute diverticulitis. 6. Additional incidental findings, as above. Electronically Signed   By: Vinnie Langton M.D.   On: 12/12/2019 07:05   Subjective: No chest pain, exerting without angina/dyspnea. Nervous about having not had a BM while admitted. Usually has diarrhea for which she's stopped metformin. Also says she has all of the side effects listed in a pamphlet with actos so has stopped this as well. Has no abdominal pain, leg swelling, orthopnea.   Discharge Exam: Vitals:   12/13/19 0532 12/13/19 0804  BP: (!) 157/70 (!) 159/58  Pulse: 76 74  Resp:  19  Temp:  98.2 F (36.8 C)  SpO2: 96% 94%   General: Pt is alert, awake, not in acute distress Cardiovascular: RRR, S1/S2 +, no rubs, no gallops Respiratory: CTA bilaterally, no wheezing, no rhonchi Abdominal: Soft, NT, ND, bowel sounds + Extremities: No edema, no cyanosis  Labs: BNP (last 3 results) No results for input(s): BNP in the last 8760 hours. Basic Metabolic Panel: Recent Labs  Lab 12/12/19 0602 12/12/19 0604  NA  --  142  K  --  4.3  CL  --  108  CO2  --  27  GLUCOSE  --  110*  BUN  --  31*  CREATININE  --  1.00  CALCIUM  --  9.4  MG 2.5*  --    Liver Function Tests: Recent Labs  Lab 12/12/19 0604  AST 23  ALT 22  ALKPHOS 64  BILITOT 0.5  PROT 7.4  ALBUMIN 3.9   Recent Labs  Lab 12/12/19 0602  LIPASE 49   No results for input(s): AMMONIA in the last 168 hours. CBC: Recent Labs  Lab 12/12/19 0602  WBC 6.1  HGB 11.8*  HCT 33.2*  MCV 96.5  PLT 221   Cardiac Enzymes: No results for input(s): CKTOTAL, CKMB, CKMBINDEX, TROPONINI in the last 168  hours. BNP: Invalid input(s): POCBNP CBG: Recent Labs  Lab 12/12/19 1116 12/12/19 1715 12/12/19 2134 12/13/19 0806  GLUCAP 90 116* 124* 105*   D-Dimer No results for input(s): DDIMER in the last 72 hours. Hgb A1c Recent Labs    12/12/19 0602  HGBA1C 7.2*   Lipid Profile No results for input(s): CHOL, HDL, LDLCALC, TRIG, CHOLHDL, LDLDIRECT in the last 72 hours. Thyroid function studies No results for input(s): TSH, T4TOTAL, T3FREE, THYROIDAB in the last 72 hours.  Invalid input(s): FREET3 Anemia work up No results for input(s): VITAMINB12, FOLATE, FERRITIN, TIBC, IRON, RETICCTPCT in the last 72 hours. Urinalysis No results found for: COLORURINE, APPEARANCEUR, [place], [place], [place], [place], [place], KETONESUR, PROTEINUR, UROBILINOGEN, NITRITE, LEUKOCYTESUR  Microbiology Recent Results (from the past 240 hour(s))  Respiratory Panel by RT PCR (Flu A&B, Covid) - Nasopharyngeal Swab     Status: None   Collection Time: 12/12/19  6:16 AM   Specimen: Nasopharyngeal Swab  Result Value Ref Range Status   SARS Coronavirus 2 by RT PCR NEGATIVE NEGATIVE Final    Comment: (NOTE) SARS-CoV-2 target nucleic acids are NOT DETECTED. The SARS-CoV-2 RNA is generally detectable in upper respiratoy specimens during the acute phase of infection. The lowest concentration of SARS-CoV-2 viral copies this assay can detect is 131 copies/mL. A negative result does not preclude SARS-Cov-2 infection and should not be used as the sole basis for treatment or other patient management decisions. A negative result may occur with  improper specimen collection/handling, submission of specimen other than nasopharyngeal swab, presence of viral mutation(s) within the areas targeted by this assay, and inadequate number of viral copies (<131 copies/mL). A negative result must be combined with clinical observations, patient history, and epidemiological information. The expected result is Negative. Fact  Sheet for Patients:  PinkCheek.be Fact Sheet for Healthcare Providers:  GravelBags.it This test is not yet ap proved or cleared by the [place] FDA and  has been authorized for detection and/or diagnosis of SARS-CoV-2 by FDA under an Emergency Use Authorization (EUA). This EUA will remain  in effect (meaning this test can be used) for the duration of the COVID-19 declaration under Section 564(b)(1) of the Act, 21 U.S.C. section 360bbb-3(b)(1), unless the  authorization is terminated or revoked sooner.    Influenza A by PCR NEGATIVE NEGATIVE Final   Influenza B by PCR NEGATIVE NEGATIVE Final    Comment: (NOTE) The Xpert Xpress SARS-CoV-2/FLU/RSV assay is intended as an aid in  the diagnosis of influenza from Nasopharyngeal swab specimens and  should not be used as a sole basis for treatment. Nasal washings and  aspirates are unacceptable for Xpert Xpress SARS-CoV-2/FLU/RSV  testing. Fact Sheet for Patients: PinkCheek.be Fact Sheet for Healthcare Providers: GravelBags.it This test is not yet approved or cleared by the [place] FDA and  has been authorized for detection and/or diagnosis of SARS-CoV-2 by  FDA under an Emergency Use Authorization (EUA). This EUA will remain  in effect (meaning this test can be used) for the duration of the  Covid-19 declaration under Section 564(b)(1) of the Act, 21  U.S.C. section 360bbb-3(b)(1), unless the authorization is  terminated or revoked. Performed at [facility], [address], [place], [place] [address]     Time coordinating discharge: Approximately 40 minutes  Patrecia Pour, MD  [facility] 12/13/2019, 4:52 PM

## 2019-12-26 ENCOUNTER — Telehealth: Payer: Self-pay | Admitting: Gastroenterology

## 2019-12-26 NOTE — Telephone Encounter (Signed)
Hey Dr. Tarri Glenn- This patient is requesting to transfer her care to you. She has previously been seen at the [facility] and states that she feels like they have not helped her and has not had a good experience. The records are in [place] for you to review. Please advise if you will accept her as a patient. Thank you!

## 2019-12-30 ENCOUNTER — Encounter: Payer: Self-pay | Admitting: Gastroenterology

## 2019-12-30 NOTE — Telephone Encounter (Signed)
OK to schedule

## 2019-12-30 NOTE — Telephone Encounter (Signed)
Patient is scheduled on 5/24 with Dr. Tarri Glenn

## 2020-01-12 ENCOUNTER — Encounter: Payer: Self-pay | Admitting: Emergency Medicine

## 2020-01-12 ENCOUNTER — Emergency Department
Admission: EM | Admit: 2020-01-12 | Discharge: 2020-01-12 | Disposition: A | Discharge status: Home / Self Care | Payer: Medicare Other | Attending: Emergency Medicine | Admitting: Emergency Medicine

## 2020-01-12 ENCOUNTER — Emergency Department: Payer: Medicare Other

## 2020-01-12 ENCOUNTER — Other Ambulatory Visit: Payer: Self-pay

## 2020-01-12 DIAGNOSIS — Z79899 Other long term (current) drug therapy: Secondary | ICD-10-CM | POA: Insufficient documentation

## 2020-01-12 DIAGNOSIS — N183 Chronic kidney disease, stage 3 unspecified: Secondary | ICD-10-CM | POA: Insufficient documentation

## 2020-01-12 DIAGNOSIS — I129 Hypertensive chronic kidney disease with stage 1 through stage 4 chronic kidney disease, or unspecified chronic kidney disease: Secondary | ICD-10-CM | POA: Insufficient documentation

## 2020-01-12 DIAGNOSIS — E1122 Type 2 diabetes mellitus with diabetic chronic kidney disease: Secondary | ICD-10-CM | POA: Diagnosis not present

## 2020-01-12 DIAGNOSIS — Z85828 Personal history of other malignant neoplasm of skin: Secondary | ICD-10-CM | POA: Diagnosis not present

## 2020-01-12 DIAGNOSIS — R1013 Epigastric pain: Secondary | ICD-10-CM | POA: Insufficient documentation

## 2020-01-12 DIAGNOSIS — Z7984 Long term (current) use of oral hypoglycemic drugs: Secondary | ICD-10-CM | POA: Insufficient documentation

## 2020-01-12 DIAGNOSIS — R197 Diarrhea, unspecified: Secondary | ICD-10-CM | POA: Diagnosis not present

## 2020-01-12 DIAGNOSIS — R079 Chest pain, unspecified: Secondary | ICD-10-CM | POA: Diagnosis present

## 2020-01-12 LAB — CBC
HCT: 33.8 % — ABNORMAL LOW (ref 36.0–46.0)
Hemoglobin: 12 g/dL (ref 12.0–15.0)
MCH: 33.8 pg (ref 26.0–34.0)
MCHC: 35.5 g/dL (ref 30.0–36.0)
MCV: 95.2 fL (ref 80.0–100.0)
Platelets: 221 10*3/uL (ref 150–400)
RBC: 3.55 MIL/uL — ABNORMAL LOW (ref 3.87–5.11)
RDW: 14.2 % (ref 11.5–15.5)
WBC: 5.3 10*3/uL (ref 4.0–10.5)
nRBC: 0 % (ref 0.0–0.2)

## 2020-01-12 LAB — COMPREHENSIVE METABOLIC PANEL
ALT: 36 U/L (ref 0–44)
AST: 52 U/L — ABNORMAL HIGH (ref 15–41)
Albumin: 4.4 g/dL (ref 3.5–5.0)
Alkaline Phosphatase: 80 U/L (ref 38–126)
Anion gap: 9 (ref 5–15)
BUN: 49 mg/dL — ABNORMAL HIGH (ref 8–23)
CO2: 25 mmol/L (ref 22–32)
Calcium: 9.9 mg/dL (ref 8.9–10.3)
Chloride: 107 mmol/L (ref 98–111)
Creatinine, Ser: 1.39 mg/dL — ABNORMAL HIGH (ref 0.44–1.00)
GFR calc Af Amer: 42 mL/min — ABNORMAL LOW (ref >60)
GFR calc non Af Amer: 36 mL/min — ABNORMAL LOW (ref >60)
Glucose, Bld: 120 mg/dL — ABNORMAL HIGH (ref 70–99)
Potassium: 4.2 mmol/L (ref 3.5–5.1)
Sodium: 141 mmol/L (ref 135–145)
Total Bilirubin: 0.6 mg/dL (ref 0.3–1.2)
Total Protein: 7.8 g/dL (ref 6.5–8.1)

## 2020-01-12 LAB — TROPONIN I (HIGH SENSITIVITY)
Troponin I (High Sensitivity): 5 ng/L (ref <18)
Troponin I (High Sensitivity): 6 ng/L (ref <18)

## 2020-01-12 LAB — LIPASE, BLOOD: Lipase: 67 U/L — ABNORMAL HIGH (ref 11–51)

## 2020-01-12 MED ORDER — SODIUM CHLORIDE 0.9% FLUSH: 3.0000 mL | Freq: Once | INTRAVENOUS | Status: DC

## 2020-01-12 MED ORDER — ONDANSETRON HCL 4 MG/2ML IJ SOLN
4.0000 mg | Freq: Once | INTRAMUSCULAR | Status: AC
  Administered 2020-01-12: 4 mg via INTRAVENOUS
  Filled 2020-01-12: qty 2

## 2020-01-12 MED ORDER — SODIUM CHLORIDE 0.9 % IV BOLUS
500.0000 mL | Freq: Once | INTRAVENOUS | Status: AC
  Administered 2020-01-12: 17:00:00 500 mL via INTRAVENOUS

## 2020-01-12 MED ORDER — HYDROMORPHONE HCL 1 MG/ML IJ SOLN
0.5000 mg | Freq: Once | INTRAMUSCULAR | Status: AC
  Administered 2020-01-12: 15:00:00 0.5 mg via INTRAVENOUS
  Filled 2020-01-12: qty 1

## 2020-01-12 MED ORDER — PANTOPRAZOLE SODIUM 40 MG IV SOLR
40.0000 mg | Freq: Once | INTRAVENOUS | Status: AC
  Administered 2020-01-12: 15:00:00 40 mg via INTRAVENOUS
  Filled 2020-01-12: qty 40

## 2020-01-12 NOTE — ED Provider Notes (Signed)
[facility] Emergency Department Provider Note  ____________________________________________   First MD Initiated Contact with Patient 01/12/20 1427     (approximate)  I have reviewed the triage vital signs and the nursing notes.   HISTORY  Chief Complaint Abdominal Pain and Diarrhea    HPI Claudia Schaefer is a 79 y.o. female with history of chronic pancreatitis, diabetes, fibromyalgia who comes in with chest pain, abdominal pain.  Patient reports that this is very similar to pain to what she had a month ago when she was admitted.  She had a CT dissection that was negative, cardiac markers were reassuring.  Patient was admitted to the hospital and discharged with cardiology follow-up.  Patient followed up with Dr. Humphrey Rolls who did a stress test and echocardiogram that were negative.  This did a repeat CT scan of the abdomen that was negative.  Patient states that her pain has been intermittent for the past month but usually very minimal but today the pain was more severe.  The pain is mostly in her chest as well as her abdomen.  She stated it started a little while after eating and then she laid down and the pain woke her up from the sleep.  The pain was constant, has not take anything to help the pain, nothing seemed to make it worse.  She denies any fevers.  Does have a little bit of loose stool this morning, 2 episodes.  Denies any recent antibiotics.  No dysuria.          Past Medical History:  Diagnosis Date  . CAD (coronary artery disease)   . Cancer ([place])    skin  . Diabetes mellitus without complication ([place])   . Fibromyalgia   . Hypertension   . Osteoarthritis     Patient Active Problem List   Diagnosis Date Noted  . Chest pain at rest 12/12/2019  . Type 2 diabetes mellitus with stage 3 chronic kidney disease ([place]) 12/12/2019  . Hypertensive urgency 12/12/2019  . GERD (gastroesophageal reflux disease) 12/12/2019    Past Surgical History:   Procedure Laterality Date  . ABDOMINAL HYSTERECTOMY    . BREAST BIOPSY Right    neg  . TONSILLECTOMY      Prior to Admission medications   Medication Sig Start Date End Date Taking? Authorizing Provider  atorvastatin (LIPITOR) 40 MG tablet Take 40 mg by mouth daily.    [provider]  glipiZIDE (GLUCOTROL XL) 10 MG 24 hr tablet Take 10 mg by mouth daily. 09/23/19   [provider]  isosorbide mononitrate (IMDUR) 120 MG 24 hr tablet Take 120 mg by mouth daily.     [provider]  losartan (COZAAR) 50 MG tablet Take 50 mg by mouth daily. 09/23/19   [provider]  metFORMIN (GLUCOPHAGE) 500 MG tablet Take 500 mg by mouth 2 (two) times daily with a meal.    [provider]  pioglitazone (ACTOS) 30 MG tablet Take 30 mg by mouth daily. 09/24/19   [provider]  traMADol (ULTRAM) 50 MG tablet Take by mouth at bedtime as needed for moderate pain.    [provider]    Allergies Penicillin v potassium, Penicillins, and Sulfa antibiotics  Family History  Problem Relation Age of Onset  . Breast cancer Neg Hx     Social History Social History   Tobacco Use  . Smoking status: Never Smoker  . Smokeless tobacco: Never Used  Substance Use Topics  .  Alcohol use: Yes    Comment: rare  . Drug use: No      Review of Systems Constitutional: No fever/chills Eyes: No visual changes. ENT: No sore throat. Cardiovascular: Positive chest pain Respiratory: Denies shortness of breath. Gastrointestinal: Positive abdominal pain, diarrhea. Genitourinary: Negative for dysuria. Musculoskeletal: Negative for back pain. Skin: Negative for rash. Neurological: Negative for headaches, focal weakness or numbness. All other ROS negative ____________________________________________   PHYSICAL EXAM:  VITAL SIGNS: ED Triage Vitals  Enc Vitals Group     BP 01/12/20 1344 (!) 147/69     Pulse Rate 01/12/20 1344 85     Resp 01/12/20  1344 (!) 28     Temp 01/12/20 1344 97.7 F (36.5 C)     Temp Source 01/12/20 1344 Oral     SpO2 01/12/20 1344 100 %     Weight 01/12/20 1344 127 lb (57.6 kg)     Height 01/12/20 1344 4\' 11"  (1.499 m)     Head Circumference --      Peak Flow --      Pain Score 01/12/20 1409 9     Pain Loc --      Pain Edu? --      Excl. in [place]? --     Constitutional: Alert and oriented. Well appearing and in no acute distress. Eyes: Conjunctivae are normal. EOMI. Head: Atraumatic. Nose: No congestion/rhinnorhea. Mouth/Throat: Mucous membranes are moist.   Neck: No stridor. Trachea Midline. FROM Cardiovascular: Normal rate, regular rhythm. Grossly normal heart sounds.  Good peripheral circulation. Respiratory: Normal respiratory effort.  No retractions. Lungs CTAB. Gastrointestinal: Soft and nontender but reportedly a little bit of epigastric tenderness no distention. No abdominal bruits.  Musculoskeletal: No lower extremity tenderness nor edema.  No joint effusions. Neurologic:  Normal speech and language. No gross focal neurologic deficits are appreciated.  Skin:  Skin is warm, dry and intact. No rash noted. Psychiatric: Mood and affect are normal. Speech and behavior are normal. GU: Deferred   ____________________________________________   LABS (all labs ordered are listed, but only abnormal results are displayed)  Labs Reviewed  LIPASE, BLOOD - Abnormal; Notable for the following components:      Result Value   Lipase 67 (*)    All other components within normal limits  COMPREHENSIVE METABOLIC PANEL - Abnormal; Notable for the following components:   Glucose, Bld 120 (*)    BUN 49 (*)    Creatinine, Ser 1.39 (*)    AST 52 (*)    GFR calc non Af Amer 36 (*)    GFR calc Af Amer 42 (*)    All other components within normal limits  CBC - Abnormal; Notable for the following components:   RBC 3.55 (*)    HCT 33.8 (*)    All other components within normal limits  GASTROINTESTINAL PANEL  BY PCR, STOOL (REPLACES STOOL CULTURE)  C DIFFICILE QUICK SCREEN W PCR REFLEX  URINALYSIS, COMPLETE (UACMP) WITH MICROSCOPIC  TROPONIN I (HIGH SENSITIVITY)   ____________________________________________   ED ECG REPORT I, Vanessa Beaver, the attending physician, personally viewed and interpreted this ECG.  EKG is normal sinus rate of 94, no ST elevation, no T wave inversions, normal intervals ____________________________________________  RADIOLOGY Pending ____________________________________________   PROCEDURES  Procedure(s) performed (including Critical Care):  Procedures   ____________________________________________   INITIAL IMPRESSION / ASSESSMENT AND PLAN / ED COURSE   Claudia Schaefer was evaluated in Emergency Department on 01/12/2020 for the symptoms described in the  history of present illness. She was evaluated in the context of the global COVID-19 pandemic, which necessitated consideration that the patient might be at risk for infection with the SARS-CoV-2 virus that causes COVID-19. Institutional protocols and algorithms that pertain to the evaluation of patients at risk for COVID-19 are in a state of rapid change based on information released by regulatory bodies including the CDC and federal and state organizations. These policies and algorithms were followed during the patient's care in the ED.    Patient comes in with chest pain abdominal pain that was very similar to 1 month ago.  We discussed do another CT dissection to rule out dissection but patient states that she does not want to have any more CTs done since is the same exact pain.  I discussed with patient that she has had a prior CT scan that was concerning for gallstone so we discussed doing an ultrasound to evaluate for gallstones to make sure there is no signs of cholecystitis.  This could be biliary colic and she may benefit from outpatient surgery consult if there is no evidence of cholecystitis.  Also  consider GI pathology such as ulcer and patient may benefit from GI consult outpatient.  I was able to get on the phone with Dr. Humphrey Rolls who stated that so far her work-up had been reassuring from a cardiology perspective however if her cardiac markers are negative that he can follow-up with her in office at 9 AM tomorrow.  DDx that was also considered d/t potential to cause harm, but was found less likely based on history and physical (as detailed above): -PNA (no fevers, cough but CXR to evaluate) -PNX (reassured with equal b/l breath sounds, CXR to evaluate) -Symptomatic anemia (will get H&H) -Pulmonary embolism as no sob at rest, not pleuritic in nature, no hypoxia -Aortic Dissection as no tearing pain and no radiation to the mid back, pulses equal -Pericarditis no rub on exam, EKG changes or hx to suggest dx -Tamponade (no notable SOB, tachycardic, hypotensive) -Esophageal rupture (no h/o diffuse vomitting/no crepitus)  Lipase slightly elevated at 67 but patient reports some chronic pancreatitis  AST slightly elevated at 52 but patient getting ultrasound evaluate for cholecystitis  Kidney function slightly admitted 1.39.  Patient will be given some fluids  Initial troponin was 5.  Will get repeat to confirm   Patient handed off to oncoming team pending ultrasound gallbladder, repeat troponin and reevaluation.  If work-up is negative I suspect patient can potentially go home with plus or minus surgery consult, GI consult and following up with cardiology if her pain is better.  If her pain is worsening and patient would like to proceed with CT imaging I think it would also be reasonable       ____________________________________________   FINAL CLINICAL IMPRESSION(S) / ED DIAGNOSES   Final diagnoses:  Epigastric pain     MEDICATIONS GIVEN DURING THIS VISIT:  Medications  sodium chloride flush (NS) 0.9 % injection 3 mL ( Intravenous Canceled Entry 01/12/20 1424)   HYDROmorphone (DILAUDID) injection 0.5 mg (0.5 mg Intravenous Given 01/12/20 1500)  ondansetron (ZOFRAN) injection 4 mg (4 mg Intravenous Given 01/12/20 1500)  pantoprazole (PROTONIX) injection 40 mg (40 mg Intravenous Given 01/12/20 1500)     ED Discharge Orders    None       Note:  This document was prepared using Dragon voice recognition software and may include unintentional dictation errors.   Vanessa Shuqualak, MD 01/12/20 1524

## 2020-01-12 NOTE — ED Provider Notes (Signed)
Plan signed out from Dr. Jari Pigg  IV fluid, pain control, RUQ u/s - > reassuring can be discharged with plan already to see patient in office with Dr. Tomasa Blase, MD 01/12/20 [id number]

## 2020-01-12 NOTE — ED Notes (Signed)
Pt in imaging

## 2020-01-12 NOTE — Discharge Instructions (Addendum)
Dr. Humphrey Rolls will follow up at [address] should see GI doctor for follow up

## 2020-01-12 NOTE — ED Notes (Signed)
[place]bow sent to the lab.  

## 2020-01-12 NOTE — ED Provider Notes (Signed)
US ABDOMEN LIMITED RUQ  Result Date: 01/12/2020 CLINICAL DATA:  Epigastric pain EXAM: ULTRASOUND ABDOMEN LIMITED RIGHT UPPER QUADRANT COMPARISON:  CT 12/12/2019 FINDINGS: Gallbladder: Echogenic nonshadowing material within the gallbladder lumen suggesting sludge and small stones. Echogenic focus with comet tail artifact within the non dependent gallbladder wall suggesting adenomyomatosis. No focal wall thickening visualized. No sonographic Murphy sign noted by sonographer. Common bile duct: Diameter: 7 mm Liver: No focal lesion identified. Within normal limits in parenchymal echogenicity. Portal vein is patent on color Doppler imaging with normal direction of blood flow towards the liver. Other: None. IMPRESSION: 1. Small amount of biliary sludge and small stones within the gallbladder lumen. No sonographic evidence of acute cholecystitis. 2. Findings suggestive of gallbladder wall adenomyomatosis. 3. Unremarkable sonographic appearance of the liver. Electronically Signed   By: Davina Poke D.O.   On: 01/12/2020 16:20     Discussed reviewed ultrasound findings with the patient.  She is resting comfortably right now.  She is talking more about her history, and she describes a bubbling feeling in her upper stomach.  She has had a history of issues with her esophagus in the past.  She is currently feeling a bit of a sense of pressure and gas bubble feeling across the epigastrium.  Her first troponin is normal, she seen cardiology recently and had cardiac evaluations with Dr. Chancy Milroy.  She is currently waiting to try to get referred to gastroenterology Dr. Alice Reichert.  In review of her symptoms I suspect the nature of illness is likely gastrointestinal and seems unlikely be cardiac.  Will obtain second troponin, if this is normal the patient is comfortable with the plan to follow-up with her doctor including Dr. Ouida Sills whom she is working with to try to get into see a gastroenterologist Dr. Alice Reichert who is office  she is already engaged but awaiting referral.  ----------------------------------------- 6:58 PM on 01/12/2020 -----------------------------------------  Return precautions and treatment recommendations provided and previously discussed with the patient.  Repeat troponin appropriate   Delman Kitten, MD 01/12/20 1859

## 2020-01-12 NOTE — ED Triage Notes (Signed)
Pt states epigastric pain and diarrhea, was seen here for the same last month, has a hx of pancreatitis. Denies vomiting. NAD. Appears in pain.

## 2020-01-12 NOTE — ED Notes (Signed)
ED Provider at bedside. 

## 2020-02-02 ENCOUNTER — Ambulatory Visit: Payer: Medicare Other | Admitting: Gastroenterology

## 2020-03-27 IMAGING — CR DG CHEST 2V
2 series · 2 of 2 positions shown · non-contrast
Comparison: 09/03/2017

CLINICAL DATA: Chest pressure, feel sick, chest pain for 1 week
with some shortness of breath yesterday, gas in abdomen, history
coronary artery disease, diabetes mellitus

EXAM:
CHEST - 2 VIEW

[chest pa]
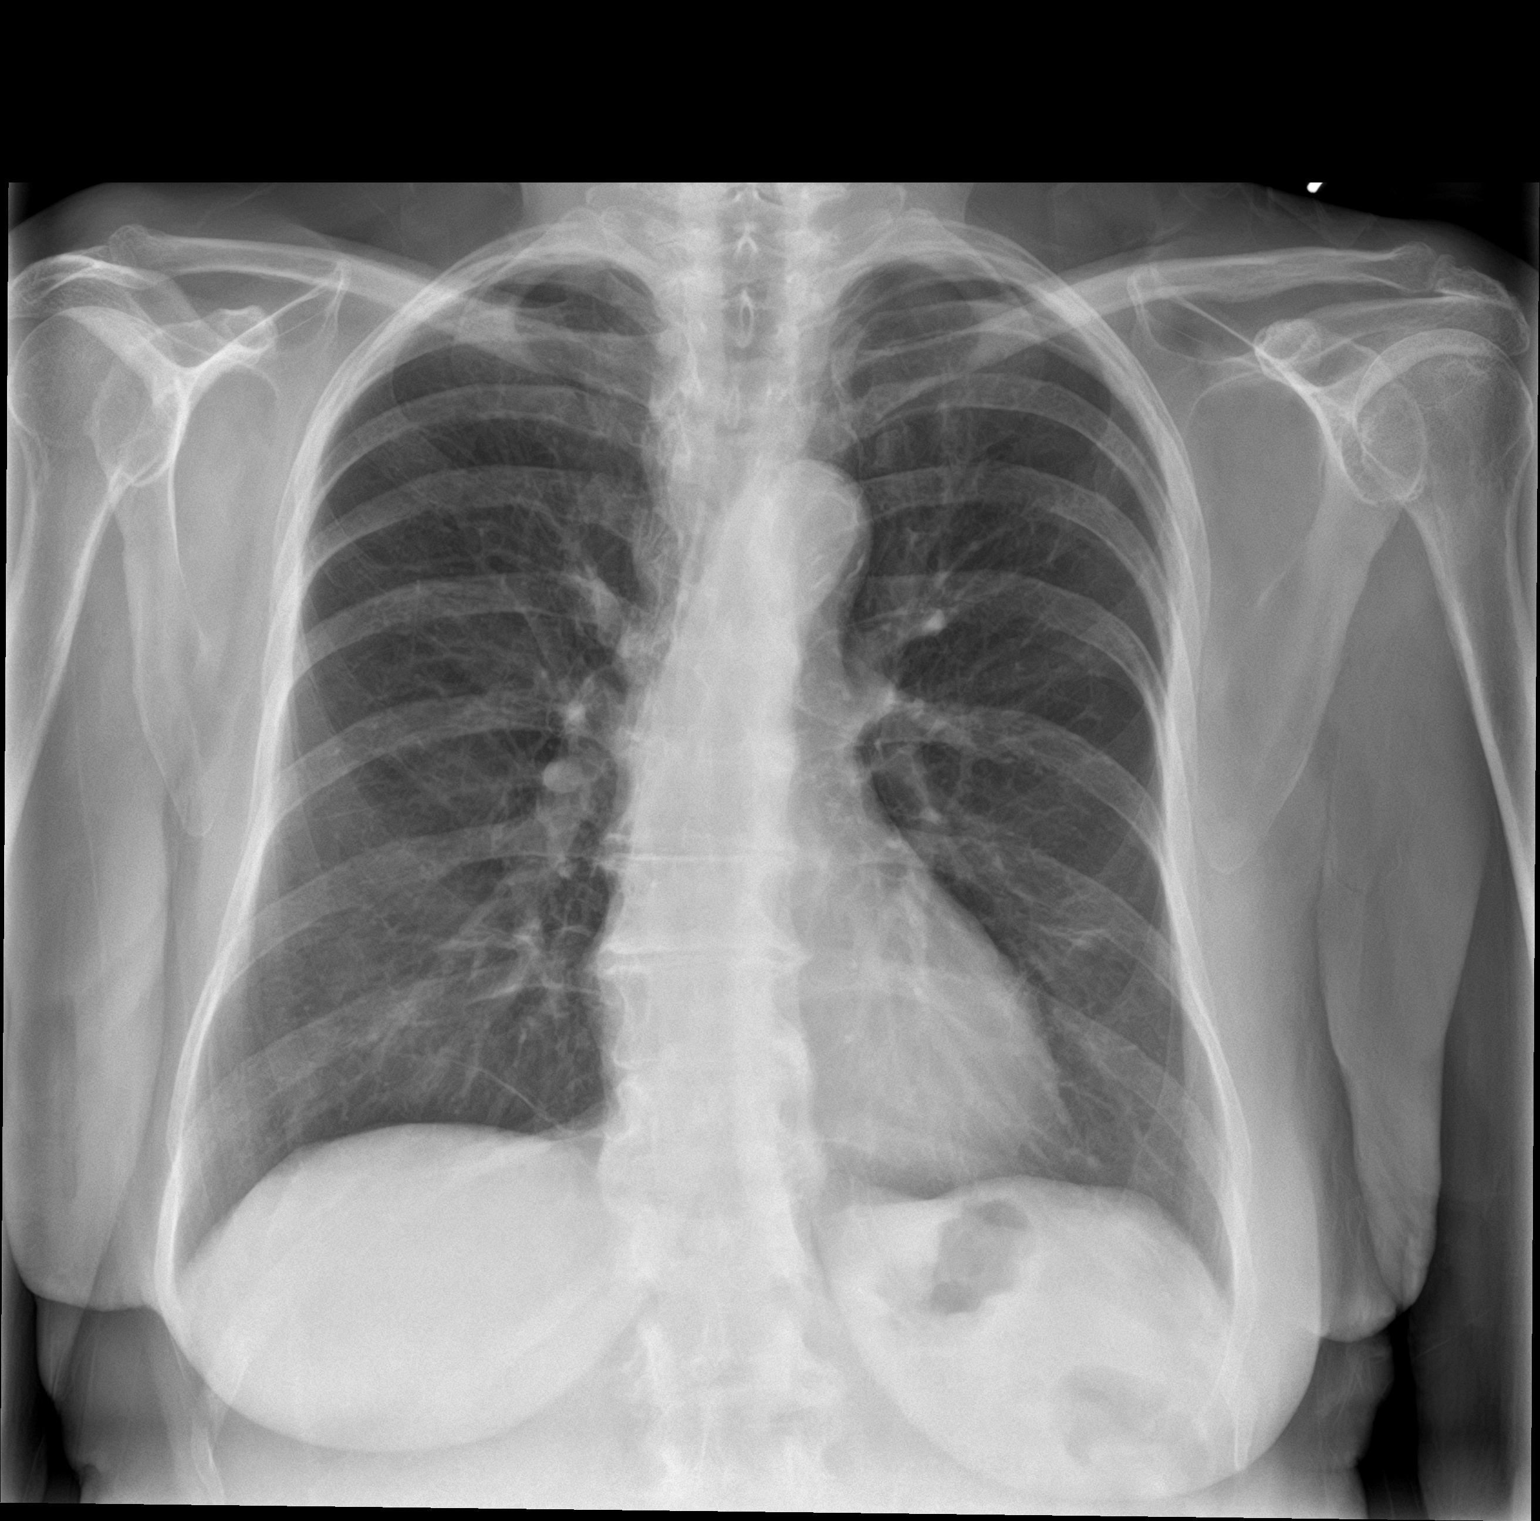

[chest lat]
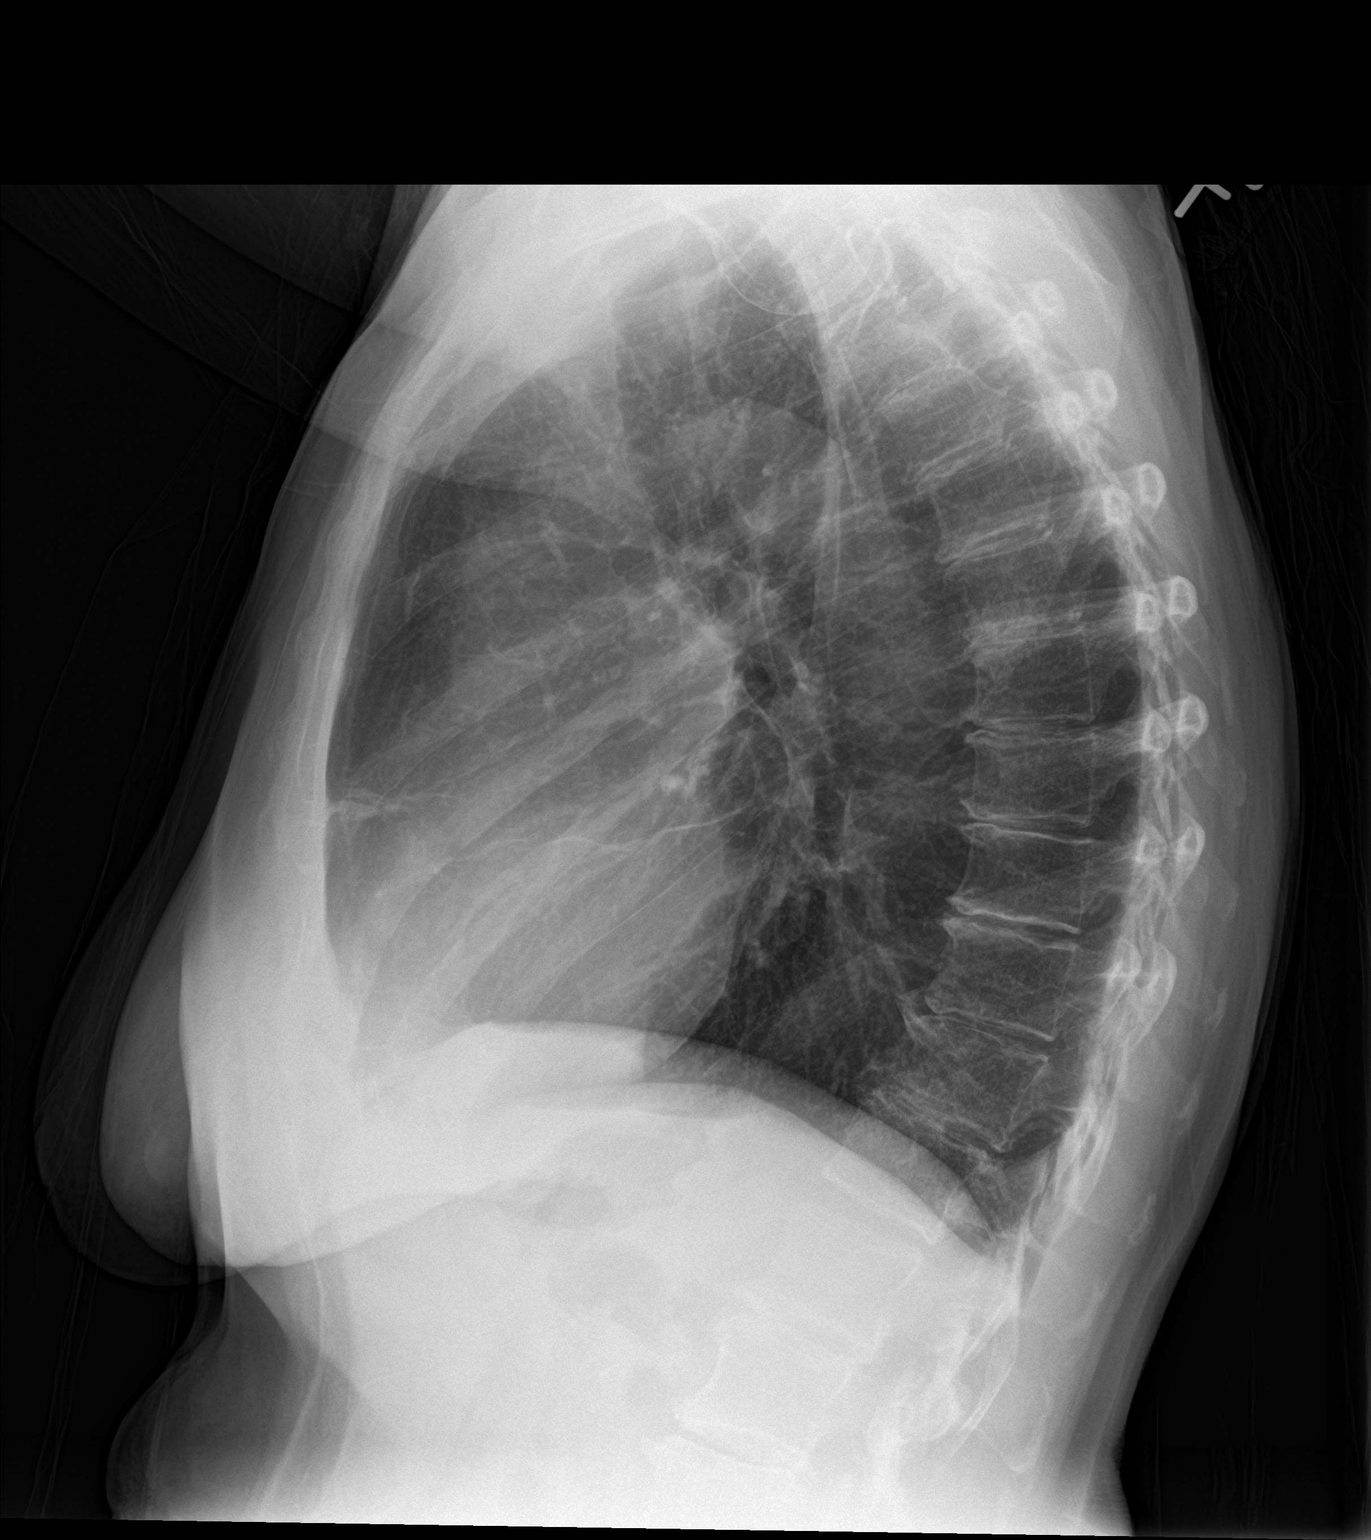

[2 of 2 positions shown; findings below may reference images not displayed]

FINDINGS: Normal heart size, mediastinal contours, and pulmonary vascularity.

Atherosclerotic calcification aorta.

Lungs clear.

Linear subsegmental atelectasis versus scarring at anterior lung
bases.

No acute infiltrate, pleural effusion or pneumothorax.

Biconvex scoliosis and scattered degenerative disc disease changes
of the thoracic spine.
IMPRESSION: Linear subsegmental atelectasis versus scarring in the anterior lung
bases.

No acute abnormalities.

## 2020-05-11 ENCOUNTER — Other Ambulatory Visit: Payer: Self-pay | Admitting: Internal Medicine

## 2020-05-11 ENCOUNTER — Inpatient Hospital Stay: Admission: RE | Admit: 2020-05-11 | Payer: Medicare Other | Source: Ambulatory Visit

## 2020-05-11 ENCOUNTER — Other Ambulatory Visit
Admission: RE | Admit: 2020-05-11 | Discharge: 2020-05-11 | Disposition: A | Discharge status: Home / Self Care | Payer: Medicare Other | Source: Ambulatory Visit | Attending: Internal Medicine | Admitting: Internal Medicine

## 2020-05-11 DIAGNOSIS — R0789 Other chest pain: Secondary | ICD-10-CM

## 2020-05-11 DIAGNOSIS — R7989 Other specified abnormal findings of blood chemistry: Secondary | ICD-10-CM

## 2020-05-11 DIAGNOSIS — R079 Chest pain, unspecified: Secondary | ICD-10-CM | POA: Diagnosis not present

## 2020-05-11 DIAGNOSIS — I2510 Atherosclerotic heart disease of native coronary artery without angina pectoris: Secondary | ICD-10-CM | POA: Diagnosis present

## 2020-05-11 LAB — FIBRIN DERIVATIVES D-DIMER (ARMC ONLY): Fibrin derivatives D-dimer (ARMC): 1624.53 ng/mL (FEU) — ABNORMAL HIGH (ref 0.00–499.00)

## 2020-05-11 LAB — TROPONIN I (HIGH SENSITIVITY): Troponin I (High Sensitivity): 8 ng/L (ref <18)

## 2020-05-12 ENCOUNTER — Ambulatory Visit: Payer: Medicare Other

## 2020-05-13 ENCOUNTER — Ambulatory Visit: Admission: RE | Admit: 2020-05-13 | Payer: Medicare Other | Source: Ambulatory Visit

## 2020-09-29 ENCOUNTER — Other Ambulatory Visit: Payer: Self-pay

## 2020-09-29 ENCOUNTER — Telehealth: Payer: Self-pay | Admitting: Surgery

## 2020-09-29 ENCOUNTER — Encounter: Payer: Self-pay | Admitting: Surgery

## 2020-09-29 ENCOUNTER — Ambulatory Visit (INDEPENDENT_AMBULATORY_CARE_PROVIDER_SITE_OTHER): Payer: Medicare Other | Admitting: Surgery

## 2020-09-29 VITALS — BP 160/81 | HR 81 | Temp 97.7°F | Ht 59.0 in | Wt 125.4 lb

## 2020-09-29 DIAGNOSIS — K449 Diaphragmatic hernia without obstruction or gangrene: Secondary | ICD-10-CM

## 2020-09-29 NOTE — Telephone Encounter (Signed)
Outbound call made & msg left requesting a call back to update info provided on AVS @ check out.  The original CT/Barrium swallow test has been changed from 10/13/20  to 10/14/20 @ 9:30 arrival time.  Pt is still NPO after midnight the night before (no food/drink).  Thank you

## 2020-09-29 NOTE — Patient Instructions (Addendum)
Patient is scheduled for CT scan and Barium Swallow on February 2nd, 2022 at [facility].  Patient is NOT to have anything to eat or drink after midnight. Patient will follow up with our office on February 16th, 2022 with Dr.Pabon.  Hiatal Hernia  A hiatal hernia occurs when part of the stomach slides above the muscle that separates the abdomen from the chest (diaphragm). A person can be born with a hiatal hernia (congenital), or it may develop over time. In almost all cases of hiatal hernia, only the top part of the stomach pushes through the diaphragm. Many people have a hiatal hernia with no symptoms. The larger the hernia, the more likely it is that you will have symptoms. In some cases, a hiatal hernia allows stomach acid to flow back into the tube that carries food from your mouth to your stomach (esophagus). This may cause heartburn symptoms. Severe heartburn symptoms may mean that you have developed a condition called gastroesophageal reflux disease (GERD). What are the causes? This condition is caused by a weakness in the opening (hiatus) where the esophagus passes through the diaphragm to attach to the upper part of the stomach. A person may be born with a weakness in the hiatus, or a weakness can develop over time. What increases the risk? This condition is more likely to develop in:  Older people. Age is a major risk factor for a hiatal hernia, especially if you are over the age of 16.  Pregnant women.  People who are overweight.  People who have frequent constipation. What are the signs or symptoms? Symptoms of this condition usually develop in the form of GERD symptoms. Symptoms include:  Heartburn.  Belching.  Indigestion.  Trouble swallowing.  Coughing or wheezing.  Sore throat.  Hoarseness.  Chest pain.  Nausea and vomiting. How is this diagnosed? This condition may be diagnosed during testing for GERD. Tests that may be done include:  X-rays of your  stomach or chest.  An upper gastrointestinal (GI) series. This is an X-ray exam of your GI tract that is taken after you swallow a chalky liquid that shows up clearly on the X-ray.  Endoscopy. This is a procedure to look into your stomach using a thin, flexible tube that has a tiny camera and light on the end of it. How is this treated? This condition may be treated by:  Dietary and lifestyle changes to help reduce GERD symptoms.  Medicines. These may include: ? Over-the-counter antacids. ? Medicines that make your stomach empty more quickly. ? Medicines that block the production of stomach acid (H2 blockers). ? Stronger medicines to reduce stomach acid (proton pump inhibitors).  Surgery to repair the hernia, if other treatments are not helping. If you have no symptoms, you may not need treatment. Follow these instructions at home: Lifestyle and activity  Do not use any products that contain nicotine or tobacco, such as cigarettes and e-cigarettes. If you need help quitting, ask your health care provider.  Try to achieve and maintain a healthy body weight.  Avoid putting pressure on your abdomen. Anything that puts pressure on your abdomen increases the amount of acid that may be pushed up into your esophagus. ? Avoid bending over, especially after eating. ? Raise the head of your bed by putting blocks under the legs. This keeps your head and esophagus higher than your stomach. ? Do not wear tight clothing around your chest or stomach. ? Try not to strain when having a bowel  movement, when urinating, or when lifting heavy objects. Eating and drinking  Avoid foods that can worsen GERD symptoms. These may include: ? Fatty foods, like fried foods. ? Citrus fruits, like oranges or lemon. ? Other foods and drinks that contain acid, like orange juice or tomatoes. ? Spicy food. ? Chocolate.  Eat frequent small meals instead of three large meals a day. This helps prevent your stomach  from getting too full. ? Eat slowly. ? Do not lie down right after eating. ? Do not eat 1-2 hours before bed.  Do not drink beverages with caffeine. These include cola, coffee, cocoa, and tea.  Do not drink alcohol. General instructions  Take over-the-counter and prescription medicines only as told by your health care provider.  Keep all follow-up visits as told by your health care provider. This is important. Contact a health care provider if:  Your symptoms are not controlled with medicines or lifestyle changes.  You are having trouble swallowing.  You have coughing or wheezing that will not go away. Get help right away if:  Your pain is getting worse.  Your pain spreads to your arms, neck, jaw, teeth, or back.  You have shortness of breath.  You sweat for no reason.  You feel sick to your stomach (nauseous) or you vomit.  You vomit blood.  You have bright red blood in your stools.  You have black, tarry stools. This information is not intended to replace advice given to you by your health care provider. Make sure you discuss any questions you have with your health care provider. Document Revised: 08/10/2017 Document Reviewed: 04/02/2017 Elsevier Patient Education  [place].

## 2020-09-29 NOTE — Telephone Encounter (Signed)
Pt returned call & confirmed changes made to CT appt 10/14/20 @ 9:30 arrival.  Thank you

## 2020-10-01 ENCOUNTER — Encounter: Payer: Self-pay | Admitting: Surgery

## 2020-10-01 NOTE — Progress Notes (Signed)
Patient ID: Claudia Schaefer, female   DOB: 1940/10/19, 80 y.o.   MRN: [id number]  HPI Claudia Schaefer is a 80 y.o. female pain in consultation at the request of Mr. Seward Carol and Dr. Lanney Gins for a traumatic paraesophageal hernia He did have a CT scan close to a year ago that I have personally reviewed this is only for PE.  There is evidence of a large paraesophageal hernia with about a 1/3 of stomach within the mediastinum. He does have experience dyspnea on exertion.  Does have chronic cough.  Does have a history of pulmonary fibrosis, diabetes, coronary artery disease. At some point in time I think that she had a shot skin ring at the GE junction that was dilated.  CBC and CMP were completely normal other than mild renal insufficiency.  Her baseline creatinine is 1.39. He is fairly independent.  She drives pay her bills.  She lives alone. Reports some dysphagia for certain meals but overall she is able to swallow pretty reasonable.  She does have some mild reflux that are currently controlled with medication.  She continues to have significant chronic cough   HPI  Past Medical History:  Diagnosis Date   CAD (coronary artery disease)    Cancer ([place])    skin   Diabetes mellitus without complication ([place])    Fibromyalgia    Hypertension    Osteoarthritis    Pulmonary fibrosis ([place])     Past Surgical History:  Procedure Laterality Date   ABDOMINAL HYSTERECTOMY     BREAST BIOPSY Right    neg   TONSILLECTOMY      Family History  Problem Relation Age of Onset   Breast cancer Neg Hx     Social History Social History   Tobacco Use   Smoking status: Never Smoker   Smokeless tobacco: Never Used  Substance Use Topics   Alcohol use: Yes    Comment: rare   Drug use: No    Allergies  Allergen Reactions   Penicillin V Potassium     Other reaction(s): Unknown   Penicillins Rash    Did it involve swelling of the face/tongue/throat, SOB, or low BP?  No Did it involve sudden or severe rash/hives, skin peeling, or any reaction on the inside of your mouth or nose? Yes Did you need to seek medical attention at a hospital or doctor's office? No When did it last happen? If all above answers are NO, may proceed with cephalosporin use.   Sulfa Antibiotics Rash and Other (See Comments)    Current Outpatient Medications  Medication Sig Dispense Refill   atorvastatin (LIPITOR) 40 MG tablet Take 40 mg by mouth daily.     DULoxetine (CYMBALTA) 30 MG capsule Take by mouth.     glipiZIDE (GLUCOTROL XL) 10 MG 24 hr tablet Take 10 mg by mouth daily.     isosorbide mononitrate (IMDUR) 120 MG 24 hr tablet Take 120 mg by mouth daily.      losartan (COZAAR) 50 MG tablet Take 50 mg by mouth daily.     Magnesium 250 MG TABS Take by mouth.     metFORMIN (GLUCOPHAGE) 1000 MG tablet      nitroGLYCERIN (NITROSTAT) 0.3 MG SL tablet      pantoprazole (PROTONIX) 40 MG tablet Take 40 mg by mouth 2 (two) times daily.     sucralfate (CARAFATE) 1 g tablet Take 1 g by mouth 4 (four) times daily.     traMADol (ULTRAM) 50 MG  tablet Take by mouth at bedtime as needed for moderate pain.     Vitamin A 2400 MCG (8000 UT) CAPS Take 8,000 Units by mouth daily.     losartan-hydrochlorothiazide (HYZAAR) 100-25 MG tablet Take 1 tablet by mouth daily. (Patient not taking: Reported on 09/29/2020)     metFORMIN (GLUCOPHAGE) 500 MG tablet Take 500 mg by mouth 2 (two) times daily with a meal. (Patient not taking: Reported on 09/29/2020)     pioglitazone (ACTOS) 30 MG tablet Take 30 mg by mouth daily. (Patient not taking: Reported on 09/29/2020)     No current facility-administered medications for this visit.     Review of Systems Full ROS  was asked and was negative except for the information on the HPI  Physical Exam Blood pressure (!) 160/81, pulse 81, temperature 97.7 F (36.5 C), temperature source Oral, height 4\' 11"  (1.499 m), weight 125 lb 6.4 oz  (56.9 kg), SpO2 98 %. CONSTITUTIONAL: NAD , scoliosis EYES: Pupils are equal, round, Sclera are non-icteric. EARS, NOSE, MOUTH AND THROAT: She is wearing a mask hearing is intact to voice. LYMPH NODES:  Lymph nodes in the neck are normal. RESPIRATORY:  Lungs are clear. There is normal respiratory effort, with equal breath sounds bilaterally, and without pathologic use of accessory muscles. CARDIOVASCULAR: Heart is regular without murmurs, gallops, or rubs. GI: The abdomen is  soft, nontender, and nondistended. There are no palpable masses. There is no hepatosplenomegaly. There are normal bowel sounds in all quadrants. GU: Rectal deferred.   MUSCULOSKELETAL: Normal muscle strength and tone. No cyanosis or edema.   SKIN: Turgor is good and there are no pathologic skin lesions or ulcers. NEUROLOGIC: Motor and sensation is grossly normal. Cranial nerves are grossly intact. PSYCH:  Oriented to person, place and time. Affect is normal.  Data Reviewed  I have personally reviewed the patient's imaging, laboratory findings and medical records.    Assessment/Plan 80 year old female with a symptomatic paraesophageal hernia.  Discussed with the patient in detail about her disease process.  I do think that the microaspiration to the lungs are causing significant pulmonary issues related to the paraesophageal hernia.  I do think that she will merit further work-up.  We will start with a CT scan of the chest and abdomen as well as a barium swallow.  I would Like to obtain current EGD to examine the esophagus and the stomach intraluminally  . Not going to add any more medication for her reflux at this time as I want to first establish a good diagnosis and evaluate the mediastinal and intra-abdominal anatomy. Please note that I spent over 60 minutes in this encounter with greater than 50% spent coordination and counseling of her care    Caroleen Hamman, MD FACS General Surgeon 10/01/2020, 2:39 PM

## 2020-10-13 ENCOUNTER — Ambulatory Visit: Payer: Medicare Other

## 2020-10-13 ENCOUNTER — Other Ambulatory Visit: Payer: Medicare Other

## 2020-10-14 ENCOUNTER — Ambulatory Visit
Admission: RE | Admit: 2020-10-14 | Discharge: 2020-10-14 | Disposition: A | Discharge status: Home / Self Care | Payer: Medicare Other | Source: Ambulatory Visit | Attending: Surgery | Admitting: Surgery

## 2020-10-14 ENCOUNTER — Ambulatory Visit: Payer: Medicare Other

## 2020-10-14 ENCOUNTER — Other Ambulatory Visit: Payer: Self-pay

## 2020-10-14 ENCOUNTER — Telehealth: Payer: Self-pay

## 2020-10-14 DIAGNOSIS — K449 Diaphragmatic hernia without obstruction or gangrene: Secondary | ICD-10-CM | POA: Diagnosis not present

## 2020-10-14 LAB — POCT I-STAT CREATININE: Creatinine, Ser: 1.4 mg/dL — ABNORMAL HIGH (ref 0.44–1.00)

## 2020-10-14 MED ORDER — IOHEXOL 300 MG/ML  SOLN
75.0000 mL | Freq: Once | INTRAMUSCULAR | Status: AC | PRN
  Administered 2020-10-14: 60 mL via INTRAVENOUS

## 2020-10-14 NOTE — Telephone Encounter (Signed)
Pt notified of CT results. Verbalizes understanding. 

## 2020-10-21 ENCOUNTER — Ambulatory Visit
Admission: RE | Admit: 2020-10-21 | Discharge: 2020-10-21 | Disposition: A | Discharge status: Home / Self Care | Payer: Medicare Other | Source: Ambulatory Visit | Attending: Surgery | Admitting: Surgery

## 2020-10-21 ENCOUNTER — Other Ambulatory Visit: Payer: Self-pay

## 2020-10-21 DIAGNOSIS — K449 Diaphragmatic hernia without obstruction or gangrene: Secondary | ICD-10-CM | POA: Insufficient documentation

## 2020-10-27 ENCOUNTER — Ambulatory Visit (INDEPENDENT_AMBULATORY_CARE_PROVIDER_SITE_OTHER): Payer: Medicare Other | Admitting: Surgery

## 2020-10-27 ENCOUNTER — Other Ambulatory Visit: Payer: Self-pay

## 2020-10-27 ENCOUNTER — Encounter: Payer: Self-pay | Admitting: Surgery

## 2020-10-27 VITALS — BP 177/75 | HR 71 | Temp 97.6°F | Ht 59.0 in | Wt 128.2 lb

## 2020-10-27 DIAGNOSIS — K449 Diaphragmatic hernia without obstruction or gangrene: Secondary | ICD-10-CM | POA: Diagnosis not present

## 2020-10-27 DIAGNOSIS — R1310 Dysphagia, unspecified: Secondary | ICD-10-CM

## 2020-10-27 NOTE — Patient Instructions (Addendum)
A referral has been placed for speech therapy. They will call you for an appointment. If you have any concerns or questions, please feel free to give our office a call.   Laparoscopic Nissen Fundoplication, Care After The following information offers guidance on how to care for yourself after your procedure. Your health care provider may also give you more specific instructions. If you have problems or questions, contact your health care provider. What can I expect after the procedure? After the procedure, it is common to have:  Trouble swallowing (dysphagia).  Discomfort when you swallow.  Soreness in your abdomen.  Bloating. Follow these instructions at home: Medicines  Take over-the-counter and prescription medicines only as told by your health care provider.  Ask your health care provider or pharmacist if you can crush any pill that you are taking. Take only liquid medicines as told.  Ask your health care provider if the medicine prescribed to you requires you to avoid driving or using machinery. Incision care  Follow instructions from your health care provider about how to take care of your incisions. Make sure you: ? Wash your hands with soap and water for at least 20 seconds before and after you change your bandage (dressing). If soap and water are not available, use hand sanitizer. ? Change your dressing as told by your health care provider. ? Leave stitches (sutures), skin glue, or adhesive strips in place. These skin closures may need to stay in place for 2 weeks or longer. If adhesive strip edges start to loosen and curl up, you may trim the loose edges. Do not remove adhesive strips completely unless your health care provider tells you to do that.  Check your incision area every day for signs of infection. Check for: ? Redness, swelling, or pain. ? Fluid or blood. ? Warmth. ? Pus or a bad smell.   Eating and drinking  Follow instructions from your health care provider  about eating or drinking restrictions. Follow these instructions carefully. ? You may need to follow a liquid-only diet for 2 weeks, followed by a diet of soft foods for 2 weeks. ? You should eat slow, take small bites, and chew food carefully. ? You should eat or drink in an upright position. ? You should return to your usual diet gradually.  Drink enough fluid to keep your urine pale yellow.   Activity  If you were given a sedative during the procedure, it can affect you for several hours. Do not drive or operate machinery until your health care provider says that it is safe.  Rest as told by your health care provider.  Avoid sitting for a long time without moving. Get up to take short walks every 1-2 hours. This is important to improve blood flow and breathing.  Do not lift anything that is heavier than 10 lb (4.5 kg), or the limit that you are told, until your health care provider says that it is safe.  Avoid activities that take a lot of effort.  Ask your health care provider what activities are safe for you. Ask when you can: ? Return to sexual activity. ? Drive. ? Go back to work.  Return to your normal activities as told by your health care provider.   General instructions  Do not take baths, swim, or use a hot tub until your health care provider approves. Ask your health care provider if you may take showers. You may only be allowed to take sponge baths.  Do not  use any products that contain nicotine or tobacco. These products include cigarettes, chewing tobacco, and vaping devices, such as e-cigarettes. These can delay healing. If you need help quitting, ask your health care provider.  Keep all follow-up visits. This is important. Contact a health care provider if:  You have chills or a fever.  You have trouble swallowing.  You have painful bloating.  You have persistent heartburn.  You have pain that does not go away with medicine.  You have frequent nausea or  vomiting.  Your incision opens up.  You have any of these signs of infection: ? Redness, swelling, or pain around your incision. ? Fluid or blood coming from your incision. ? Warmth coming from your incision. ? Pus or a bad smell coming from your incision. ? A fever. Get help right away if:  You have severe pain or severe bloating.  You are unable to swallow.  You have vomiting that does not stop.  You have blood in your vomit.  You have trouble breathing. These symptoms may represent a serious problem that is an emergency. Do not wait to see if the symptoms will go away. Get medical help right away. Call your local emergency services (911 in the [place] Do not drive yourself to the hospital. Summary  After the procedure, it is common to have trouble swallowing or have discomfort when you swallow.  Follow instructions from your health care provider about eating or drinking restrictions. You may need to follow a liquid-only diet for 2 weeks, followed by a diet of soft foods for 2 weeks.  Return to your normal activities as told by your health care provider.  Keep all follow-up visits as told by your health care provider. This is important. This information is not intended to replace advice given to you by your health care provider. Make sure you discuss any questions you have with your health care provider. Document Revised: 03/14/2020 Document Reviewed: 03/14/2020 Elsevier Patient Education  [place].   Hiatal Hernia  A hiatal hernia occurs when part of the stomach slides above the muscle that separates the abdomen from the chest (diaphragm). A person can be born with a hiatal hernia (congenital), or it may develop over time. In almost all cases of hiatal hernia, only the top part of the stomach pushes through the diaphragm. Many people have a hiatal hernia with no symptoms. The larger the hernia, the more likely it is that you will have symptoms. In some cases, a  hiatal hernia allows stomach acid to flow back into the tube that carries food from your mouth to your stomach (esophagus). This may cause heartburn symptoms. Severe heartburn symptoms may mean that you have developed a condition called gastroesophageal reflux disease (GERD). What are the causes? This condition is caused by a weakness in the opening (hiatus) where the esophagus passes through the diaphragm to attach to the upper part of the stomach. A person may be born with a weakness in the hiatus, or a weakness can develop over time. What increases the risk? This condition is more likely to develop in:  Older people. Age is a major risk factor for a hiatal hernia, especially if you are over the age of 23.  Pregnant women.  People who are overweight.  People who have frequent constipation. What are the signs or symptoms? Symptoms of this condition usually develop in the form of GERD symptoms. Symptoms include:  Heartburn.  Belching.  Indigestion.  Trouble swallowing.  Coughing  or wheezing.  Sore throat.  Hoarseness.  Chest pain.  Nausea and vomiting. How is this diagnosed? This condition may be diagnosed during testing for GERD. Tests that may be done include:  X-rays of your stomach or chest.  An upper gastrointestinal (GI) series. This is an X-ray exam of your GI tract that is taken after you swallow a chalky liquid that shows up clearly on the X-ray.  Endoscopy. This is a procedure to look into your stomach using a thin, flexible tube that has a tiny camera and light on the end of it. How is this treated? This condition may be treated by:  Dietary and lifestyle changes to help reduce GERD symptoms.  Medicines. These may include: ? Over-the-counter antacids. ? Medicines that make your stomach empty more quickly. ? Medicines that block the production of stomach acid (H2 blockers). ? Stronger medicines to reduce stomach acid (proton pump inhibitors).  Surgery to  repair the hernia, if other treatments are not helping. If you have no symptoms, you may not need treatment. Follow these instructions at home: Lifestyle and activity  Do not use any products that contain nicotine or tobacco, such as cigarettes and e-cigarettes. If you need help quitting, ask your health care provider.  Try to achieve and maintain a healthy body weight.  Avoid putting pressure on your abdomen. Anything that puts pressure on your abdomen increases the amount of acid that may be pushed up into your esophagus. ? Avoid bending over, especially after eating. ? Raise the head of your bed by putting blocks under the legs. This keeps your head and esophagus higher than your stomach. ? Do not wear tight clothing around your chest or stomach. ? Try not to strain when having a bowel movement, when urinating, or when lifting heavy objects. Eating and drinking  Avoid foods that can worsen GERD symptoms. These may include: ? Fatty foods, like fried foods. ? Citrus fruits, like oranges or lemon. ? Other foods and drinks that contain acid, like orange juice or tomatoes. ? Spicy food. ? Chocolate.  Eat frequent small meals instead of three large meals a day. This helps prevent your stomach from getting too full. ? Eat slowly. ? Do not lie down right after eating. ? Do not eat 1-2 hours before bed.  Do not drink beverages with caffeine. These include cola, coffee, cocoa, and tea.  Do not drink alcohol. General instructions  Take over-the-counter and prescription medicines only as told by your health care provider.  Keep all follow-up visits as told by your health care provider. This is important. Contact a health care provider if:  Your symptoms are not controlled with medicines or lifestyle changes.  You are having trouble swallowing.  You have coughing or wheezing that will not go away. Get help right away if:  Your pain is getting worse.  Your pain spreads to your  arms, neck, jaw, teeth, or back.  You have shortness of breath.  You sweat for no reason.  You feel sick to your stomach (nauseous) or you vomit.  You vomit blood.  You have bright red blood in your stools.  You have black, tarry stools. This information is not intended to replace advice given to you by your health care provider. Make sure you discuss any questions you have with your health care provider. Document Revised: 08/10/2017 Document Reviewed: 04/02/2017 Elsevier Patient Education  2021 [place] After Nissen Fundoplication After a Nissen fundoplication procedure,  it is common to have some difficulty swallowing. The part of your body that moves food and liquid from your mouth to your stomach (esophagus) will be swollen and may feel tight. It will take several weeks or months for your esophagus and stomach to heal. By following a special eating plan, you can prevent problems such as pain, swelling or pressure in the abdomen (bloating), gas, nausea, or diarrhea. What are tips for following this plan? Cooking  Cook all foods until they are soft.  Remove skins and seeds from fruits and vegetables before eating.  Remove skin and gristle from meats. Grind or finely mince meats before eating.  Avoid over-cooking meat. Dry, tough meat is more difficult to swallow.  Avoid using oil when cooking, or use only a small amount of oil.  Avoid using seasoning when cooking, or use only a small amount of seasoning.  Toast bread before eating. This makes it easier to swallow. Meal planning  Eat 6-8 small meals throughout the day.  Right after the surgery, have a few meals that are only clear liquids. Clear liquids include: ? Water. ? Clear fruit juice, no pulp. ? Chicken, beef, or vegetable broth. ? Gelatin. ? Decaffeinated tea or coffee without milk. ? Popsicles or shaved ice.  Depending on your progress, you may move to a full liquid diet as told by  your health care provider. This includes clear liquids and the following: ? Dairy and alternative milks, such as soy milk. ? Strained creamed soups. ? Ice cream or sherbet. ? Pudding. ? Nutritional supplement drinks. ? Yogurt.  A few days after surgery, you may be able to start eating a diet of soft foods. You may need to eat according to this plan for several weeks.  Do not eat sweets or sweetened drinks at the beginning of a meal. Doing that may cause your stomach to empty faster than it should (dumping syndrome).   Lifestyle  Always sit upright when eating or drinking.  Eat slowly. Take small bites and chew food well before swallowing.  Do not lie down after eating. Stay sitting up for 30 minutes or longer after each meal.  Sip fluids between meals.  Limit how much you drink at one time. With meals and snacks, have 4-8 oz (120-240 mL). This is equal to  cup-1 cup.  Do not mix solid foods and liquids in the same mouthful.  Drink enough fluid to keep your urine pale yellow.  Do not chew gum or drink fluids through a straw. Doing those things may cause you to swallow extra air. General information  Do not drink carbonated drinks or alcohol.  Avoid foods and drinks that contain caffeine and chocolate.  Avoid foods and drinks that contain citrus or tomato.  Allow hot soups and drinks to cool before eating.  Avoid foods that cause gas, such as beans, peas, broccoli, or cabbage.  If dairy milk products cause diarrhea, avoid them or eat them in small amounts. Recommended foods Fruits Any soft-cooked fruits after skins and seeds are removed. Fruit juice. Vegetables Any soft-cooked vegetables after skins and seeds are removed. Vegetable juice. Grains  Cooked cereals. Dry cereals softened with liquid. Cooked pasta, rice, or other grains. Toasted bread. Bland crackers, such as soda or graham crackers. Meats and other protein foods  Tender cuts of meat, poultry, or fish  after bones, skin, and gristle are removed. Poached, boiled, or scrambled eggs. Canned fish. Tofu. Creamy nut butters. Dairy  Milk. Yogurt. Cottage cheese.  Mild cheeses. Beverages  Nutritional supplement drinks. Decaffeinated tea or coffee. Sports drinks. Fats and oils  Butter. Margarine. Mayonnaise. Vegetable oil. Smooth salad dressing. Sweets and desserts  Plain hard candy. Marshmallows. Pudding. Ice cream. Gelatin. Sherbet. Seasoning and other foods  Salt. Light seasonings. Mustard. Vinegar. The items listed above may not be a complete list of recommended foods and beverages. Contact a dietitian for more information. Foods to avoid Fruits Oranges. Grapefruit. Lemons. Limes. Citrus juices. Dried fruit. Crunchy, raw fruits. Vegetables Tomato sauce. Tomato juice. Broccoli. Cauliflower. Cabbage. Brussels sprouts. Crunchy, raw vegetables. Grains  High-fiber or bran cereal. Cereal with nuts, dried fruit, or coconut. Sweet breads, rolls, coffee cake, or donuts. Chewy or crusty breads. Popcorn. Meats and other protein foods  Beans, peas, and lentils. Tough or fatty meats. Fried meats, chicken, or fish. Fried eggs. Nuts and seeds. Crunchy nut butters. Dairy  Chocolate milk. Yogurt with chunks of fruit, nuts, seeds, or coconut. Strong cheeses. Beverages  Carbonated soft drinks. Alcohol. Cocoa. Hot drinks. Fats and oils  Bacon fat. Lard. Sweets and desserts  Chocolate. Candy with nuts, coconut, or seeds. Peppermint. Cookies. Cakes. Pie crust. Seasoning and other foods  Heavy seasonings. Chili sauce. Ketchup. Barbecue sauce. Angie Fava. Horseradish. The items listed above may not be a complete list of foods and beverages to avoid. Contact a dietitian for more information. Summary  Following this eating plan after a Nissen fundoplication is an important part of healing after surgery.  After surgery, you will start with a clear liquid diet before you progress to full liquids and  soft foods. You may need to eat soft foods for several weeks.  Avoid eating foods that cause irritation, gas, nausea, diarrhea, or swelling or pressure in the abdomen (bloating), and avoid foods that are difficult to swallow.  Talk with a dietitian about which dietary choices are best for you. This information is not intended to replace advice given to you by your health care provider. Make sure you discuss any questions you have with your health care provider. Document Revised: 03/14/2020 Document Reviewed: 03/14/2020 Elsevier Patient Education  [employer].

## 2020-10-29 ENCOUNTER — Other Ambulatory Visit: Payer: Self-pay

## 2020-10-29 NOTE — Progress Notes (Signed)
Outpatient Surgical Follow Up  10/29/2020  Claudia Schaefer is an 80 y.o. female.   Chief Complaint  Patient presents with  . Follow-up    Barium swallow and CT    HPI: Following up for paraesophageal hernia.  She underwent a CT scan of the abdomen pelvis as well as a barium swallow that I have personally reviewed and shared with the patient.  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  Also reflux and some esophageal dysmotility.  No evident aspiration but there is deep penetration to just above the vocal cords of contrast. SHe continues to have regurgitation, reflux some dysphagia.  He is looking forward for definitive repair. He also completed an EGD by Dr. Alice Reichert unfortunately do not have any records or images at this time.  Past Medical History:  Diagnosis Date  . CAD (coronary artery disease)   . Cancer ([place])    skin  . Diabetes mellitus without complication ([place])   . Fibromyalgia   . Hypertension   . Osteoarthritis   . Pulmonary fibrosis ([place])     Past Surgical History:  Procedure Laterality Date  . ABDOMINAL HYSTERECTOMY    . BREAST BIOPSY Right    neg  . TONSILLECTOMY      Family History  Problem Relation Age of Onset  . Breast cancer Neg Hx     Social History:  reports that she has never smoked. She has never used smokeless tobacco. She reports current alcohol use. She reports that she does not use drugs.  Allergies:  Allergies  Allergen Reactions  . Penicillin V Potassium     Other reaction(s): Unknown  . Penicillins Rash    Did it involve swelling of the face/tongue/throat, SOB, or low BP? No Did it involve sudden or severe rash/hives, skin peeling, or any reaction on the inside of your mouth or nose? Yes Did you need to seek medical attention at a hospital or doctor's office? No When did it last happen? If all above answers are "NO", may proceed with cephalosporin  use.  . Sulfa Antibiotics Rash and Other (See Comments)    Medications reviewed.    ROS Full ROS performed and is otherwise negative other than what is stated in HPI   BP (!) 177/75   Pulse 71   Temp 97.6 F (36.4 C) (Oral)   Ht 4\' 11"  (1.499 m)   Wt 128 lb 3.2 oz (58.2 kg)   SpO2 98%   BMI 25.89 kg/m   Physical Exam Vitals and nursing note reviewed. Exam conducted with a chaperone present.  Constitutional:      General: She is not in acute distress.    Appearance: Normal appearance. She is normal weight.  Eyes:     General: No scleral icterus.       Right eye: No discharge.        Left eye: No discharge.  Cardiovascular:     Rate and Rhythm: Normal rate and regular rhythm.  Pulmonary:     Effort: Pulmonary effort is normal. No respiratory distress.     Breath sounds: Normal breath sounds. No stridor. No wheezing.  Abdominal:     General: Abdomen is flat. There is no distension.     Palpations: Abdomen is soft. There is no mass.     Tenderness: There is no abdominal tenderness. There is no guarding or rebound.     Hernia: No hernia is  present.  Musculoskeletal:        General: No swelling or tenderness. Normal range of motion.     Cervical back: Normal range of motion and neck supple. No rigidity or tenderness.  Skin:    General: Skin is warm and dry.     Capillary Refill: Capillary refill takes less than 2 seconds.  Neurological:     General: No focal deficit present.     Mental Status: She is alert and oriented to person, place, and time.  Psychiatric:        Mood and Affect: Mood normal.        Behavior: Behavior normal.        Thought Content: Thought content normal.        Judgment: Judgment normal.    Assessment/Plan: 80 year old female with symptomatic paraesophageal hernia.  She does have some dysphagia issues that warrant further work-up.  We will get speech pathology input regarding this condition may need ENT consultation.  I do think that she will  be a good candidate for robotic paraesophageal hernia repair but before we commit to surgical intervention we will have to make sure that swallowing mechanism is assessed and addressed.  There is no need for any emergent surgical intervention at this time.  I did spend significant time in counseling the patient and explaining her about the disease process.  I personally showed her all the imaging studies. We will also obtain appropriate images from EGD greater than 50% of the 24minutes  visit was spent in counseling/coordination of care   Caroleen Hamman, MD [facility]

## 2020-11-02 ENCOUNTER — Other Ambulatory Visit: Payer: Self-pay | Admitting: Surgery

## 2020-11-02 DIAGNOSIS — K449 Diaphragmatic hernia without obstruction or gangrene: Secondary | ICD-10-CM

## 2020-11-02 DIAGNOSIS — R1312 Dysphagia, oropharyngeal phase: Secondary | ICD-10-CM

## 2020-11-09 ENCOUNTER — Other Ambulatory Visit: Payer: Self-pay

## 2020-11-09 ENCOUNTER — Ambulatory Visit
Admission: RE | Admit: 2020-11-09 | Discharge: 2020-11-09 | Disposition: A | Discharge status: Home / Self Care | Payer: Medicare Other | Source: Ambulatory Visit | Attending: Surgery | Admitting: Surgery

## 2020-11-09 DIAGNOSIS — K449 Diaphragmatic hernia without obstruction or gangrene: Secondary | ICD-10-CM | POA: Diagnosis not present

## 2020-11-09 DIAGNOSIS — R1312 Dysphagia, oropharyngeal phase: Secondary | ICD-10-CM | POA: Diagnosis present

## 2020-11-09 NOTE — Therapy (Signed)
[place], [place], [address] Phone: [phone]   Fax:     Modified Barium Swallow  Patient Details  Name: Claudia Schaefer MRN: [id number] Date of Birth: 03-02-1941 No data recorded  Encounter Date: 11/09/2020   End of Session - 11/09/20 1300    Visit Number 1    Number of Visits 1    Date for SLP Re-Evaluation 11/09/20    SLP Start Time 9767    SLP Stop Time  1255    SLP Time Calculation (min) 20 min    Activity Tolerance Patient tolerated treatment well           Past Medical History:  Diagnosis Date  . CAD (coronary artery disease)   . Cancer ([place])    skin  . Diabetes mellitus without complication ([place])   . Fibromyalgia   . Hypertension   . Osteoarthritis   . Pulmonary fibrosis ([place])     Past Surgical History:  Procedure Laterality Date  . ABDOMINAL HYSTERECTOMY    . BREAST BIOPSY Right    neg  . TONSILLECTOMY      There were no vitals filed for this visit.   Objective Swallowing Evaluation: Type of Study: MBS-Modified Barium Swallow Study   Patient Details  Name: Claudia Schaefer MRN: [id number] Date of Birth: 1941-07-24  Today's Date: 11/09/2020 Time: SLP Start Time (ACUTE ONLY): 4097 -SLP Stop Time (ACUTE ONLY): 1255  SLP Time Calculation (min) (ACUTE ONLY): 20 min   Past Medical History:  Past Medical History:  Diagnosis Date  . CAD (coronary artery disease)   . Cancer ([place])    skin  . Diabetes mellitus without complication ([place])   . Fibromyalgia   . Hypertension   . Osteoarthritis   . Pulmonary fibrosis ([place])    Past Surgical History:  Past Surgical History:  Procedure Laterality Date  . ABDOMINAL HYSTERECTOMY    . BREAST BIOPSY Right    neg  . TONSILLECTOMY     HPI: Claudia Schaefer is a 80 year old female referred by Caroleen Hamman, MD for MBS. Recently had barium swallow on 10/21/20 showing deep laryngeal penetration, no gross aspiration. Patient  has extensive esophageal history, including Schatzki ring at GE junction (per EGD on 10/08/20), hiatal hernia ("approximately 50% of the stomach herniated into the chest with paraesophageal and sliding type configuration"), esophageal dysmotility and reflux (to the level of the proximal esophagus during barium swallow). Patient denies coughing or choking with meals, her complaint is expectorating "mucus" which is clear, thin, frothy. Hx is also noted for pulmonary fibrosis, HTN, DM, CAD, fibromyalgia, osteoarthritis.   Subjective: "I have a hernia."    Assessment / Plan / Recommendation  CHL IP CLINICAL IMPRESSIONS 11/09/2020  Clinical Impression Patient presents with grossly functional oropharyngeal swallow given normative age-related changes in swallow function. Oral stage is characterized by adequate oral control and manipulation, rotary mastication, and anterior to posterior transit. Pharyngeal swallow initiation is delayed to the level of the pyriform sinuses with liquids, resulting in deep but mostly transient laryngeal penetration. A trace amount of contrast remained in the laryngeal vestibule post swallow after multiple sips of thin liquid, which pt was able to eject with cued throat clear. Base of tongue retraction, hyolaryngeal excursion, and pharyngeal constriction are within normal limits, with complete stripping of contrast from the pharynx. Epiglottic deflection is complete. Amplitude/duration of cricopharyngeus opening is within normal limits. An esophageal sweep was performed in the upright  position, presence of hiatal hernia noted per the radiologist. Patient was provided with handout and education regarding reflux and behavioral modifications, positioning to reduce symptoms. Recommend she continue regular diet and thin liquids, meds whole with liquid. Sit fully upright during and for at least 30 minutes after meals, single bites/sips, alternate solids and liquids, eat smaller/more frequent  meals, clear throat intermittently at meals. No further skilled ST is recommended at this time. Overall mild risk for aspiration primarily due to reflux, esophageal dysphagia.  SLP Visit Diagnosis Dysphagia, pharyngeal phase (R13.13)  Attention and concentration deficit following --  Frontal lobe and executive function deficit following --  Impact on safety and function Mild aspiration risk      CHL IP TREATMENT RECOMMENDATION 11/09/2020  Treatment Recommendations No treatment recommended at this time     Prognosis 11/09/2020  Prognosis for Safe Diet Advancement Good  Barriers to Reach Goals --  Barriers/Prognosis Comment --    CHL IP DIET RECOMMENDATION 11/09/2020  SLP Diet Recommendations Regular solids;Thin liquid  Liquid Administration via Cup  Medication Administration Whole meds with liquid  Compensations Slow rate;Small sips/bites;Clear throat intermittently  Postural Changes Remain semi-upright after after feeds/meals (Comment);Seated upright at 90 degrees      CHL IP OTHER RECOMMENDATIONS 11/09/2020  Recommended Consults --  Oral Care Recommendations Oral care BID  Other Recommendations --      CHL IP FOLLOW UP RECOMMENDATIONS 11/09/2020  Follow up Recommendations None      CHL IP FREQUENCY AND DURATION 11/09/2020  Speech Therapy Frequency (ACUTE ONLY) (No Data)  Treatment Duration --           CHL IP ORAL PHASE 11/09/2020  Oral Phase WFL  Oral - Pudding Teaspoon --  Oral - Pudding Cup --  Oral - Honey Teaspoon --  Oral - Honey Cup --  Oral - Nectar Teaspoon --  Oral - Nectar Cup --  Oral - Nectar Straw --  Oral - Thin Teaspoon --  Oral - Thin Cup --  Oral - Thin Straw --  Oral - Puree --  Oral - Mech Soft --  Oral - Regular --  Oral - Multi-Consistency --  Oral - Pill --  Oral Phase - Comment --    CHL IP PHARYNGEAL PHASE 11/09/2020  Pharyngeal Phase Impaired  Pharyngeal- Pudding Teaspoon --  Pharyngeal --  Pharyngeal- Pudding Cup --  Pharyngeal --   Pharyngeal- Honey Teaspoon --  Pharyngeal --  Pharyngeal- Honey Cup --  Pharyngeal --  Pharyngeal- Nectar Teaspoon --  Pharyngeal --  Pharyngeal- Nectar Cup Delayed swallow initiation-pyriform sinuses  Pharyngeal Material does not enter airway  Pharyngeal- Nectar Straw --  Pharyngeal --  Pharyngeal- Thin Teaspoon --  Pharyngeal --  Pharyngeal- Thin Cup Delayed swallow initiation-pyriform sinuses;Penetration/Aspiration before swallow  Pharyngeal Material does not enter airway;Material enters airway, remains ABOVE vocal cords and not ejected out  Pharyngeal- Thin Straw --  Pharyngeal --  Pharyngeal- Puree Delayed swallow initiation-vallecula  Pharyngeal Material does not enter airway  Pharyngeal- Mechanical Soft WFL  Pharyngeal Material does not enter airway  Pharyngeal- Regular --  Pharyngeal --  Pharyngeal- Multi-consistency --  Pharyngeal --  Pharyngeal- Pill --  Pharyngeal --  Pharyngeal Comment --     CHL IP CERVICAL ESOPHAGEAL PHASE 11/09/2020  Cervical Esophageal Phase WFL  Pudding Teaspoon --  Pudding Cup --  Honey Teaspoon --  Honey Cup --  Nectar Teaspoon --  Nectar Cup --  Nectar Straw --  Thin Teaspoon --  Thin  Cup --  Thin Straw --  Puree --  Mechanical Soft --  Regular --  Multi-consistency --  Pill --  Cervical Esophageal Comment --   Deneise Lever, MS, CCC-SLP Speech-Language Pathologist   Aliene Altes 11/09/2020, 1:36 PM                                                 Hiatal hernia - Plan: DG SWALLOW FUNC OP MEDICARE SPEECH PATH, DG SWALLOW FUNC OP MEDICARE SPEECH PATH  Oropharyngeal dysphagia - Plan: DG SWALLOW FUNC OP MEDICARE SPEECH PATH, DG SWALLOW FUNC OP MEDICARE SPEECH PATH        Problem List Patient Active Problem List   Diagnosis Date Noted  . Chest pain at rest 12/12/2019  . 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 Hypertensive urgency 12/12/2019   . GERD (gastroesophageal reflux disease) 12/12/2019   Deneise Lever, Nadine, [place] Speech-Language Pathologist  Aliene Altes 11/09/2020, 1:35 PM  [facility] [place], [place], [address] Phone: [phone]   Fax:     Name: Claudia Schaefer MRN: [id number] Date of Birth: [birth year]

## 2020-11-17 ENCOUNTER — Telehealth: Payer: Self-pay | Admitting: Surgery

## 2020-11-17 ENCOUNTER — Ambulatory Visit (INDEPENDENT_AMBULATORY_CARE_PROVIDER_SITE_OTHER): Payer: Medicare Other | Admitting: Surgery

## 2020-11-17 ENCOUNTER — Encounter: Payer: Self-pay | Admitting: Surgery

## 2020-11-17 ENCOUNTER — Other Ambulatory Visit: Payer: Self-pay

## 2020-11-17 VITALS — BP 158/83 | HR 80 | Temp 98.7°F | Ht 59.0 in | Wt 124.4 lb

## 2020-11-17 DIAGNOSIS — K449 Diaphragmatic hernia without obstruction or gangrene: Secondary | ICD-10-CM

## 2020-11-17 NOTE — Patient Instructions (Addendum)
Our surgery scheduler will call you within 24-48 hours to schedule your surgery. Please have the [place] surgery sheet available when speaking with her. Please call our office if you have questions or concerns.    Laparoscopic Nissen Fundoplication  Laparoscopic Nissen fundoplication is a surgery to relieve heartburn and other problems caused by fluid from your stomach (gastric fluids) flowing up into your esophagus. The esophagus is the part of the body that moves food from the mouth to the stomach. Normally, the muscle that sits between the stomach and the esophagus (lower esophageal sphincter, LES) keeps stomach fluids in the stomach. In some people, the LES does not work properly, and stomach fluids flow up into the esophagus (reflux). This can happen when part of the stomach bulges through the LES (hiatal hernia). The backward flow of stomach fluids can cause a type of severe and long-lasting heartburn that is called gastroesophageal reflux disease (GERD). You may need this surgery if other treatments for GERD have not helped. In this procedure, the upper part of your stomach is wrapped around the lower end of your esophagus and stitched together (sutured). This tightens the connection between your esophagus and stomach to prevent stomach acid reflux. Tell a health care provider about:  Any allergies you have.  All medicines you are taking, including vitamins, herbs, eye drops, creams, and over-the-counter medicines.  Any problems you or family members have had with anesthetic medicines.  Any blood disorders you have.  Any surgeries you have had.  Any medical conditions you have.  Whether you are pregnant or may be pregnant. What are the risks? Generally, this is a safe procedure. However, problems may occur, including:  Infection.  Bleeding.  Damage to other structures or organs. This can include damage to the lung, causing a collapsed lung.  Trouble swallowing (dysphagia).  Blood  clots.  Allergic reactions to medicines. What happens before the procedure? Staying hydrated  Follow instructions from your health care provider about hydration, which may include: ? Up to two hours before the procedure - you may continue to drink clear liquids, such as water, clear fruit juice, black coffee and plain tea. Eating and drinking restrictions  Follow instructions from your health care provider about eating and drinking, which may include: ? 8 hours before the procedure - stop eating heavy meals or foods, such as meat, fried foods, or fatty foods. ? 6 hours before the procedure - stop eating light meals or foods, such as toast or cereal. ? 6 hours before the procedure - stop drinking milk or drinks that contain milk. ? 2 hours before the procedure - stop drinking clear liquids. Medicines  Ask your health care provider about: ? Changing or stopping your regular medicines. This is especially important if you are taking diabetes medicines or blood thinners. ? Taking medicines such as aspirin and ibuprofen. These medicines can thin your blood. Do not take these medicines unless your health care provider tells you to take them. ? Taking over-the-counter medicines, vitamins, herbs, and supplements. Tests Your health care provider will do tests to plan the procedure. This may include:  An exam using a flexible scope passed down your esophagus into your stomach (endoscopy).  Imaging studies. General instructions  Plan to have a responsible adult take you home from the hospital or clinic.  Ask your health care provider: ? How your surgery site will be marked. ? What steps will be taken to help prevent infection. These steps may include:  Removing hair at  the surgery site.  Washing skin with a germ-killing soap.  Taking antibiotic medicine.  Do not use any products that contain nicotine or tobacco for at least 4 weeks before the procedure. These products include cigarettes,  chewing tobacco, and vaping devices, such as e-cigarettes. If you need help quitting, ask your health care provider. What happens during the procedure?  An IV will be inserted into one of your veins.  You will be given medicine in your IV to help you relax (sedative) just before the procedure and a medicine to make you fall asleep (general anesthetic).  You may have a tube placed through your nose into your stomach to drain stomach acid during the procedure (nasogastric tube).  The surgeon will make a small incision in your abdomen and insert a tube through the incision.  Your abdomen will be filled with a gas. This helps the surgeon see your organs better, and it makes more space to work.  The surgeon will insert a thin, lighted tube (laparoscope) through the small incision. This allows your surgeon to see into your abdomen.  The surgeon will make several other small incisions in your abdomen to insert the other instruments that are needed during the procedure.  Another instrument (dilator) will be passed through your mouth and down your esophagus into the upper part of your stomach. The dilator will prevent your LES from being closed too tightly during surgery.  The upper part of your stomach will be wrapped around the lower part of your esophagus and will be stitched into place. This will strengthen the lower esophageal sphincter and prevent reflux.  If you have a hiatal hernia, it will be repaired.  The gas will be released from your abdomen.  All instruments will be removed, and the incisions will be closed with stitches (sutures).  A bandage (dressing) will be placed on your skin over the incisions. The procedure may vary among health care providers and hospitals. What happens after the procedure?  Your blood pressure, heart rate, breathing rate, and blood oxygen level will be monitored until you leave the hospital or clinic.  You will be given pain medicine as needed.  Your  IV will be kept in until you are able to drink fluids.  You will be encouraged to get up and walk around as soon as possible. Summary  Laparoscopic Nissen fundoplication is a surgery to relieve heartburn and other problems caused by gastric fluids flowing up into your esophagus.  You may need this surgery if other treatments for GERD have not helped.  Follow instructions from your health care provider about eating and drinking before the procedure.  Your surgeon will use a thin, lighted tube (laparoscope) that is inserted through a small incision, allowing the surgeon to see into your abdomen. This information is not intended to replace advice given to you by your health care provider. Make sure you discuss any questions you have with your health care provider. Document Revised: 03/14/2020 Document Reviewed: 03/14/2020 Elsevier Patient Education  [address] After Nissen Fundoplication After a Nissen fundoplication procedure, it is common to have some difficulty swallowing. The part of your body that moves food and liquid from your mouth to your stomach (esophagus) will be swollen and may feel tight. It will take several weeks or months for your esophagus and stomach to heal. By following a special eating plan, you can prevent problems such as pain, swelling or pressure in the abdomen (bloating), gas, nausea, or  diarrhea. What are tips for following this plan? Cooking  Cook all foods until they are soft.  Remove skins and seeds from fruits and vegetables before eating.  Remove skin and gristle from meats. Grind or finely mince meats before eating.  Avoid over-cooking meat. Dry, tough meat is more difficult to swallow.  Avoid using oil when cooking, or use only a small amount of oil.  Avoid using seasoning when cooking, or use only a small amount of seasoning.  Toast bread before eating. This makes it easier to swallow. Meal planning  Eat 6-8 small meals  throughout the day.  Right after the surgery, have a few meals that are only clear liquids. Clear liquids include: ? Water. ? Clear fruit juice, no pulp. ? Chicken, beef, or vegetable broth. ? Gelatin. ? Decaffeinated tea or coffee without milk. ? Popsicles or shaved ice.  Depending on your progress, you may move to a full liquid diet as told by your health care provider. This includes clear liquids and the following: ? Dairy and alternative milks, such as soy milk. ? Strained creamed soups. ? Ice cream or sherbet. ? Pudding. ? Nutritional supplement drinks. ? Yogurt.  A few days after surgery, you may be able to start eating a diet of soft foods. You may need to eat according to this plan for several weeks.  Do not eat sweets or sweetened drinks at the beginning of a meal. Doing that may cause your stomach to empty faster than it should (dumping syndrome).   Lifestyle  Always sit upright when eating or drinking.  Eat slowly. Take small bites and chew food well before swallowing.  Do not lie down after eating. Stay sitting up for 30 minutes or longer after each meal.  Sip fluids between meals.  Limit how much you drink at one time. With meals and snacks, have 4-8 oz (120-240 mL). This is equal to  cup-1 cup.  Do not mix solid foods and liquids in the same mouthful.  Drink enough fluid to keep your urine pale yellow.  Do not chew gum or drink fluids through a straw. Doing those things may cause you to swallow extra air. General information  Do not drink carbonated drinks or alcohol.  Avoid foods and drinks that contain caffeine and chocolate.  Avoid foods and drinks that contain citrus or tomato.  Allow hot soups and drinks to cool before eating.  Avoid foods that cause gas, such as beans, peas, broccoli, or cabbage.  If dairy milk products cause diarrhea, avoid them or eat them in small amounts. Recommended foods Fruits Any soft-cooked fruits after skins and  seeds are removed. Fruit juice. Vegetables Any soft-cooked vegetables after skins and seeds are removed. Vegetable juice. Grains  Cooked cereals. Dry cereals softened with liquid. Cooked pasta, rice, or other grains. Toasted bread. Bland crackers, such as soda or graham crackers. Meats and other protein foods  Tender cuts of meat, poultry, or fish after bones, skin, and gristle are removed. Poached, boiled, or scrambled eggs. Canned fish. Tofu. Creamy nut butters. Dairy  Milk. Yogurt. Cottage cheese. Mild cheeses. Beverages  Nutritional supplement drinks. Decaffeinated tea or coffee. Sports drinks. Fats and oils  Butter. Margarine. Mayonnaise. Vegetable oil. Smooth salad dressing. Sweets and desserts  Plain hard candy. Marshmallows. Pudding. Ice cream. Gelatin. Sherbet. Seasoning and other foods  Salt. Light seasonings. Mustard. Vinegar. The items listed above may not be a complete list of recommended foods and beverages. Contact a dietitian for more information.  Foods to avoid Fruits Oranges. Grapefruit. Lemons. Limes. Citrus juices. Dried fruit. Crunchy, raw fruits. Vegetables Tomato sauce. Tomato juice. Broccoli. Cauliflower. Cabbage. Brussels sprouts. Crunchy, raw vegetables. Grains  High-fiber or bran cereal. Cereal with nuts, dried fruit, or coconut. Sweet breads, rolls, coffee cake, or donuts. Chewy or crusty breads. Popcorn. Meats and other protein foods  Beans, peas, and lentils. Tough or fatty meats. Fried meats, chicken, or fish. Fried eggs. Nuts and seeds. Crunchy nut butters. Dairy  Chocolate milk. Yogurt with chunks of fruit, nuts, seeds, or coconut. Strong cheeses. Beverages  Carbonated soft drinks. Alcohol. Cocoa. Hot drinks. Fats and oils  Bacon fat. Lard. Sweets and desserts  Chocolate. Candy with nuts, coconut, or seeds. Peppermint. Cookies. Cakes. Pie crust. Seasoning and other foods  Heavy seasonings. Chili sauce. Ketchup. Barbecue sauce.  Angie Fava. Horseradish. The items listed above may not be a complete list of foods and beverages to avoid. Contact a dietitian for more information. Summary  Following this eating plan after a Nissen fundoplication is an important part of healing after surgery.  After surgery, you will start with a clear liquid diet before you progress to full liquids and soft foods. You may need to eat soft foods for several weeks.  Avoid eating foods that cause irritation, gas, nausea, diarrhea, or swelling or pressure in the abdomen (bloating), and avoid foods that are difficult to swallow.  Talk with a dietitian about which dietary choices are best for you. This information is not intended to replace advice given to you by your health care provider. Make sure you discuss any questions you have with your health care provider. Document Revised: 03/14/2020 Document Reviewed: 03/14/2020 Elsevier Patient Education  [place].

## 2020-11-17 NOTE — Telephone Encounter (Signed)
Outgoing call is made, unable to leave a message.  Trying to get in touch with patient to discuss surgical dates.

## 2020-11-19 ENCOUNTER — Telehealth: Payer: Self-pay | Admitting: Surgery

## 2020-11-19 ENCOUNTER — Encounter: Payer: Self-pay | Admitting: Surgery

## 2020-11-19 NOTE — H&P (View-Only) (Signed)
Outpatient Surgical Follow Up  11/19/2020  Claudia Schaefer is an 80 y.o. female.   Chief Complaint  Patient presents with  . Follow-up    Discuss surgery-hiatal hernia    HPI: Claudia Schaefer is a 80 year old female well-known to me with history of symptomatic paraesophageal hernia.  She did have some swallowing issues underwent specific swallowing study by speech pathology.  She complains of dysphagia expectorating mucus.  Patient recommended to continue regular diet and thin liquids.  Mild risk for aspiration due to reflux. She underwent a CT scan of the abdomen pelvis as well as a barium swallow that I have personally reviewed and shared with the patient.  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  Noted reflux . Also personally reviewed the endoscopy once again showing a paraesophageal hernia and Schatzki ring.  No need for any dilation at that time.     Past Medical History:  Diagnosis Date  . CAD (coronary artery disease)   . Cancer ([place])    skin  . Diabetes mellitus without complication ([place])   . Fibromyalgia   . Hypertension   . Osteoarthritis   . Pulmonary fibrosis ([place])     Past Surgical History:  Procedure Laterality Date  . ABDOMINAL HYSTERECTOMY    . BREAST BIOPSY Right    neg  . TONSILLECTOMY      Family History  Problem Relation Age of Onset  . Breast cancer Neg Hx     Social History:  reports that she has never smoked. She has never used smokeless tobacco. She reports current alcohol use. She reports that she does not use drugs.  Allergies:  Allergies  Allergen Reactions  . Penicillin V Potassium     Other reaction(s): Unknown  . Penicillins Rash    Did it involve swelling of the face/tongue/throat, SOB, or low BP? No Did it involve sudden or severe rash/hives, skin peeling, or any reaction on the inside of your mouth or nose? Yes Did you need to seek medical attention at  a hospital or doctor's office? No When did it last happen? If all above answers are "NO", may proceed with cephalosporin use.  . Sulfa Antibiotics Rash and Other (See Comments)    Medications reviewed.    ROS Full ROS performed and is otherwise negative other than what is stated in HPI   BP (!) 158/83   Pulse 80   Temp 98.7 F (37.1 C) (Oral)   Ht 4\' 11"  (1.499 m)   Wt 124 lb 6.4 oz (56.4 kg)   SpO2 97%   BMI 25.13 kg/m   Physical Exam Vitals and nursing note reviewed. Exam conducted with a chaperone present.  Constitutional:      General: She is not in acute distress.    Appearance: Normal appearance. She is normal weight.  Eyes:     General: No scleral icterus.       Left eye: No discharge.     Extraocular Movements: Extraocular movements intact.     Pupils: Pupils are equal, round, and reactive to light.  Neck:     Vascular: No carotid bruit.  Cardiovascular:     Rate and Rhythm: Normal rate and regular rhythm.     Heart sounds: No murmur heard.   Pulmonary:     Effort: Pulmonary effort is normal. No respiratory distress.     Breath sounds: Normal breath sounds. Stridor present.  Abdominal:  General: Abdomen is flat. There is no distension.     Palpations: Abdomen is soft. There is no mass.     Tenderness: There is no abdominal tenderness. There is no rebound.     Hernia: No hernia is present.  Musculoskeletal:     Cervical back: Normal range of motion and neck supple. No rigidity or tenderness.  Lymphadenopathy:     Cervical: No cervical adenopathy.  Skin:    General: Skin is warm and dry.     Capillary Refill: Capillary refill takes less than 2 seconds.  Neurological:     General: No focal deficit present.     Mental Status: She is alert and oriented to person, place, and time.  Psychiatric:        Mood and Affect: Mood normal.        Behavior: Behavior normal.        Thought Content: Thought content normal.        Judgment: Judgment  normal.        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  I do think that this hernia can be certainly be fixed.  I do think that she will be a good candidate for robotic approach.  I do think that her dysphagia  And regurgitation is related to the paraesophageal hernia itself  (I do think that the fundus is trapped within the mediastinum causing her to have regurgitation ) and not related to a primary esophageal motility disorder. I will still be debating whether or not to do a full 160 fundoplication versus 109 degree fundoplication.  I will base my decision on operative findings  Procedure discussed with the patient in detail.,  Benefits and possible complications including but not limited to: Bleeding, infection, esophageal injuries, bowel injuries, dysphagia and recurrence.  She understands and wishes to proceed.  Extensive counseling provided   Greater than 50% of the 45 minutes  visit was spent in counseling/coordination of care   Caroleen Hamman, MD [facility] Surgeon

## 2020-11-19 NOTE — Telephone Encounter (Signed)
Patient has been advised of Pre-Admission date/time, COVID Testing date and Surgery date.  Surgery Date: 12/14/20 Preadmission Testing Date: 12/06/20 (phone 8a-1p) Covid Testing Date: 12/10/20  In person at 8:45 am - patient advised to go to the [facility] ([place])   Patient has been made aware to call [phone], between 1-3:00pm the day before surgery, to find out what time to arrive for surgery.

## 2020-11-19 NOTE — Progress Notes (Signed)
Outpatient Surgical Follow Up  11/19/2020  Claudia Schaefer is an 80 y.o. female.   Chief Complaint  Patient presents with  . Follow-up    Discuss surgery-hiatal hernia    HPI: Claudia Schaefer is a 80 year old female well-known to me with history of symptomatic paraesophageal hernia.  She did have some swallowing issues underwent specific swallowing study by speech pathology.  She complains of dysphagia expectorating mucus.  Patient recommended to continue regular diet and thin liquids.  Mild risk for aspiration due to reflux. She underwent a CT scan of the abdomen pelvis as well as a barium swallow that I have personally reviewed and shared with the patient.  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  Noted reflux . Also personally reviewed the endoscopy once again showing a paraesophageal hernia and Schatzki ring.  No need for any dilation at that time.     Past Medical History:  Diagnosis Date  . CAD (coronary artery disease)   . Cancer ([place])    skin  . Diabetes mellitus without complication ([place])   . Fibromyalgia   . Hypertension   . Osteoarthritis   . Pulmonary fibrosis ([place])     Past Surgical History:  Procedure Laterality Date  . ABDOMINAL HYSTERECTOMY    . BREAST BIOPSY Right    neg  . TONSILLECTOMY      Family History  Problem Relation Age of Onset  . Breast cancer Neg Hx     Social History:  reports that she has never smoked. She has never used smokeless tobacco. She reports current alcohol use. She reports that she does not use drugs.  Allergies:  Allergies  Allergen Reactions  . Penicillin V Potassium     Other reaction(s): Unknown  . Penicillins Rash    Did it involve swelling of the face/tongue/throat, SOB, or low BP? No Did it involve sudden or severe rash/hives, skin peeling, or any reaction on the inside of your mouth or nose? Yes Did you need to seek medical attention at  a hospital or doctor's office? No When did it last happen? If all above answers are "NO", may proceed with cephalosporin use.  . Sulfa Antibiotics Rash and Other (See Comments)    Medications reviewed.    ROS Full ROS performed and is otherwise negative other than what is stated in HPI   BP (!) 158/83   Pulse 80   Temp 98.7 F (37.1 C) (Oral)   Ht 4\' 11"  (1.499 m)   Wt 124 lb 6.4 oz (56.4 kg)   SpO2 97%   BMI 25.13 kg/m   Physical Exam Vitals and nursing note reviewed. Exam conducted with a chaperone present.  Constitutional:      General: She is not in acute distress.    Appearance: Normal appearance. She is normal weight.  Eyes:     General: No scleral icterus.       Left eye: No discharge.     Extraocular Movements: Extraocular movements intact.     Pupils: Pupils are equal, round, and reactive to light.  Neck:     Vascular: No carotid bruit.  Cardiovascular:     Rate and Rhythm: Normal rate and regular rhythm.     Heart sounds: No murmur heard.   Pulmonary:     Effort: Pulmonary effort is normal. No respiratory distress.     Breath sounds: Normal breath sounds. Stridor present.  Abdominal:  General: Abdomen is flat. There is no distension.     Palpations: Abdomen is soft. There is no mass.     Tenderness: There is no abdominal tenderness. There is no rebound.     Hernia: No hernia is present.  Musculoskeletal:     Cervical back: Normal range of motion and neck supple. No rigidity or tenderness.  Lymphadenopathy:     Cervical: No cervical adenopathy.  Skin:    General: Skin is warm and dry.     Capillary Refill: Capillary refill takes less than 2 seconds.  Neurological:     General: No focal deficit present.     Mental Status: She is alert and oriented to person, place, and time.  Psychiatric:        Mood and Affect: Mood normal.        Behavior: Behavior normal.        Thought Content: Thought content normal.        Judgment: Judgment  normal.        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  I do think that this hernia can be certainly be fixed.  I do think that she will be a good candidate for robotic approach.  I do think that her dysphagia  And regurgitation is related to the paraesophageal hernia itself  (I do think that the fundus is trapped within the mediastinum causing her to have regurgitation ) and not related to a primary esophageal motility disorder. I will still be debating whether or not to do a full 831 fundoplication versus 517 degree fundoplication.  I will base my decision on operative findings  Procedure discussed with the patient in detail.,  Benefits and possible complications including but not limited to: Bleeding, infection, esophageal injuries, bowel injuries, dysphagia and recurrence.  She understands and wishes to proceed.  Extensive counseling provided   Greater than 50% of the 45 minutes  visit was spent in counseling/coordination of care   Caroleen Hamman, MD [place] Surgeon

## 2020-12-06 ENCOUNTER — Other Ambulatory Visit: Payer: Self-pay

## 2020-12-06 ENCOUNTER — Encounter
Admission: RE | Admit: 2020-12-06 | Discharge: 2020-12-06 | Disposition: A | Discharge status: Home / Self Care | Payer: Medicare Other | Source: Ambulatory Visit | Attending: Surgery | Admitting: Surgery

## 2020-12-06 HISTORY — DX: Personal history of other diseases of the digestive system: Z87.19

## 2020-12-06 HISTORY — DX: Chronic kidney disease, unspecified: N18.9

## 2020-12-06 HISTORY — DX: Gastro-esophageal reflux disease without esophagitis: K21.9

## 2020-12-06 NOTE — Patient Instructions (Signed)
Your procedure is scheduled on: 12/14/20 Report to [place]. To find out your arrival time please call [phone] between 1PM - 3PM on 12/13/20.  Remember: Instructions that are not followed completely may result in serious medical risk, up to and including death, or upon the discretion of your surgeon and anesthesiologist your surgery may need to be rescheduled.     _X__ 1. Do not eat food after midnight the night before your procedure.                 No gum chewing or hard candies. You may drink clear liquids up to 2 hours                 before you are scheduled to arrive for your surgery- DO not drink clear                 liquids within 2 hours of the start of your surgery.                 Clear Liquids include:  water, apple juice without pulp, clear carbohydrate                 drink such as Clearfast or Gatorade, Black Coffee or Tea (Do not add                 anything to coffee or tea). Diabetics water only  __X__2.  On the morning of surgery brush your teeth with toothpaste and water, you                 may rinse your mouth with mouthwash if you wish.  Do not swallow any              toothpaste of mouthwash.     _X__ 3.  No Alcohol for 24 hours before or after surgery.   _X__ 4.  Do Not Smoke or use e-cigarettes For 24 Hours Prior to Your Surgery.                 Do not use any chewable tobacco products for at least 6 hours prior to                 surgery.  ____  5.  Bring all medications with you on the day of surgery if instructed.   __X__  6.  Notify your doctor if there is any change in your medical condition      (cold, fever, infections).     Do not wear jewelry, make-up, hairpins, clips or nail polish. Do not wear lotions, powders, or perfumes.  Do not shave 48 hours prior to surgery. Men may shave face and neck. Do not bring valuables to the hospital.    [facility] is not responsible for any belongings or  valuables.  Contacts, dentures/partials or body piercings may not be worn into surgery. Bring a case for your contacts, glasses or hearing aids, a denture cup will be supplied. Leave your suitcase in the car. After surgery it may be brought to your room. For patients admitted to the hospital, discharge time is determined by your treatment team.   Patients discharged the day of surgery will not be allowed to drive home.   Please read over the following fact sheets that you were given:   MRSA Information  __X__ Take these medicines the morning of surgery with A SIP OF WATER:  1.   2.   3.   4.  5.  6.  ____ Fleet Enema (as directed)   __X__ Use CHG Soap/SAGE wipes as directed  ____ Use inhalers on the day of surgery  ____ Stop metformin/Janumet/Farxiga 2 days prior to surgery    ____ Take 1/2 of usual insulin dose the night before surgery. No insulin the morning          of surgery.   ____ Stop Blood Thinners Coumadin/Plavix/Xarelto/Pleta/Pradaxa/Eliquis/Effient/Aspirin  on   Or contact your Surgeon, Cardiologist or Medical Doctor regarding  ability to stop your blood thinners  __X__ Stop Anti-inflammatories 7 days before surgery such as Advil, Ibuprofen, Motrin,  BC or Goodies Powder, Naprosyn, Naproxen, Aleve, Aspirin    __X__ Stop all herbal supplements, fish oil or vitamin E until after surgery.    ____ Bring C-Pap to the hospital.      

## 2020-12-10 ENCOUNTER — Other Ambulatory Visit
Admission: RE | Admit: 2020-12-10 | Discharge: 2020-12-10 | Disposition: A | Discharge status: Home / Self Care | Payer: Medicare Other | Source: Ambulatory Visit | Attending: Surgery | Admitting: Surgery

## 2020-12-10 ENCOUNTER — Other Ambulatory Visit: Payer: Self-pay

## 2020-12-10 DIAGNOSIS — Z01812 Encounter for preprocedural laboratory examination: Secondary | ICD-10-CM | POA: Insufficient documentation

## 2020-12-10 DIAGNOSIS — Z20822 Contact with and (suspected) exposure to covid-19: Secondary | ICD-10-CM | POA: Diagnosis not present

## 2020-12-10 LAB — CBC
HCT: 33.8 % — ABNORMAL LOW (ref 36.0–46.0)
Hemoglobin: 12.3 g/dL (ref 12.0–15.0)
MCH: 35.4 pg — ABNORMAL HIGH (ref 26.0–34.0)
MCHC: 36.4 g/dL — ABNORMAL HIGH (ref 30.0–36.0)
MCV: 97.4 fL (ref 80.0–100.0)
Platelets: 232 10*3/uL (ref 150–400)
RBC: 3.47 MIL/uL — ABNORMAL LOW (ref 3.87–5.11)
RDW: 13.5 % (ref 11.5–15.5)
WBC: 5.2 10*3/uL (ref 4.0–10.5)
nRBC: 0 % (ref 0.0–0.2)

## 2020-12-10 LAB — BASIC METABOLIC PANEL
Anion gap: 8 (ref 5–15)
BUN: 28 mg/dL — ABNORMAL HIGH (ref 8–23)
CO2: 25 mmol/L (ref 22–32)
Calcium: 9.6 mg/dL (ref 8.9–10.3)
Chloride: 105 mmol/L (ref 98–111)
Creatinine, Ser: 1.3 mg/dL — ABNORMAL HIGH (ref 0.44–1.00)
GFR, Estimated: 42 mL/min — ABNORMAL LOW (ref >60)
Glucose, Bld: 194 mg/dL — ABNORMAL HIGH (ref 70–99)
Potassium: 3.6 mmol/L (ref 3.5–5.1)
Sodium: 138 mmol/L (ref 135–145)

## 2020-12-10 LAB — SARS CORONAVIRUS 2 (TAT 6-24 HRS): SARS Coronavirus 2: NEGATIVE

## 2020-12-14 ENCOUNTER — Inpatient Hospital Stay: Payer: Medicare Other | Admitting: Urgent Care

## 2020-12-14 ENCOUNTER — Observation Stay
Admission: RE | Admit: 2020-12-14 | Discharge: 2020-12-15 | Disposition: A | Discharge status: Home / Self Care | Payer: Medicare Other | Attending: Surgery | Admitting: Surgery

## 2020-12-14 ENCOUNTER — Encounter: Payer: Self-pay | Admitting: Surgery

## 2020-12-14 ENCOUNTER — Other Ambulatory Visit: Payer: Self-pay

## 2020-12-14 ENCOUNTER — Encounter
Admission: RE | Disposition: A | Discharge status: Home / Self Care | Payer: Self-pay | Source: Home / Self Care | Attending: Surgery

## 2020-12-14 DIAGNOSIS — K449 Diaphragmatic hernia without obstruction or gangrene: Secondary | ICD-10-CM | POA: Diagnosis present

## 2020-12-14 DIAGNOSIS — I129 Hypertensive chronic kidney disease with stage 1 through stage 4 chronic kidney disease, or unspecified chronic kidney disease: Secondary | ICD-10-CM | POA: Insufficient documentation

## 2020-12-14 DIAGNOSIS — Z79899 Other long term (current) drug therapy: Secondary | ICD-10-CM | POA: Insufficient documentation

## 2020-12-14 DIAGNOSIS — I2510 Atherosclerotic heart disease of native coronary artery without angina pectoris: Secondary | ICD-10-CM | POA: Diagnosis not present

## 2020-12-14 DIAGNOSIS — Z7984 Long term (current) use of oral hypoglycemic drugs: Secondary | ICD-10-CM | POA: Diagnosis not present

## 2020-12-14 DIAGNOSIS — Z85828 Personal history of other malignant neoplasm of skin: Secondary | ICD-10-CM | POA: Diagnosis not present

## 2020-12-14 DIAGNOSIS — Z8719 Personal history of other diseases of the digestive system: Secondary | ICD-10-CM

## 2020-12-14 DIAGNOSIS — E1122 Type 2 diabetes mellitus with diabetic chronic kidney disease: Secondary | ICD-10-CM | POA: Diagnosis not present

## 2020-12-14 DIAGNOSIS — N183 Chronic kidney disease, stage 3 unspecified: Secondary | ICD-10-CM | POA: Insufficient documentation

## 2020-12-14 HISTORY — PX: XI ROBOTIC ASSISTED PARAESOPHAGEAL HERNIA REPAIR: SHX6871

## 2020-12-14 LAB — CBC
HCT: 36.8 % (ref 36.0–46.0)
Hemoglobin: 12.1 g/dL (ref 12.0–15.0)
MCH: 31.3 pg (ref 26.0–34.0)
MCHC: 32.9 g/dL (ref 30.0–36.0)
MCV: 95.3 fL (ref 80.0–100.0)
Platelets: 217 10*3/uL (ref 150–400)
RBC: 3.86 MIL/uL — ABNORMAL LOW (ref 3.87–5.11)
RDW: 13.5 % (ref 11.5–15.5)
WBC: 11.5 10*3/uL — ABNORMAL HIGH (ref 4.0–10.5)
nRBC: 0 % (ref 0.0–0.2)

## 2020-12-14 LAB — CREATININE, SERUM
Creatinine, Ser: 1.34 mg/dL — ABNORMAL HIGH (ref 0.44–1.00)
GFR, Estimated: 41 mL/min — ABNORMAL LOW (ref >60)

## 2020-12-14 LAB — TROPONIN I (HIGH SENSITIVITY)
Troponin I (High Sensitivity): 248 ng/L (ref <18)
Troponin I (High Sensitivity): 302 ng/L (ref <18)
Troponin I (High Sensitivity): 308 ng/L (ref <18)

## 2020-12-14 LAB — GLUCOSE, CAPILLARY
Glucose-Capillary: 132 mg/dL — ABNORMAL HIGH (ref 70–99)
Glucose-Capillary: 249 mg/dL — ABNORMAL HIGH (ref 70–99)
Glucose-Capillary: 150 mg/dL — ABNORMAL HIGH (ref 70–99)
Glucose-Capillary: 201 mg/dL — ABNORMAL HIGH (ref 70–99)

## 2020-12-14 LAB — HEMOGLOBIN A1C
Hgb A1c MFr Bld: 6.8 % — ABNORMAL HIGH (ref 4.8–5.6)
Mean Plasma Glucose: 148.46 mg/dL

## 2020-12-14 SURGERY — REPAIR, HERNIA, PARAESOPHAGEAL, ROBOT-ASSISTED
Anesthesia: General

## 2020-12-14 MED ORDER — ACETAMINOPHEN 500 MG PO TABS
1000.0000 mg | ORAL_TABLET | Freq: Four times a day (QID) | ORAL | Status: DC
  Administered 2020-12-14 – 2020-12-15 (×4): 1000 mg via ORAL
  Filled 2020-12-14 (×4): qty 2

## 2020-12-14 MED ORDER — SUCCINYLCHOLINE CHLORIDE 200 MG/10ML IV SOSY
PREFILLED_SYRINGE | INTRAVENOUS | Status: AC
  Filled 2020-12-14: qty 10

## 2020-12-14 MED ORDER — HEPARIN SODIUM (PORCINE) 5000 UNIT/ML IJ SOLN: 5000.0000 [IU] | Freq: Once | INTRAMUSCULAR | Status: AC

## 2020-12-14 MED ORDER — ISOSORBIDE MONONITRATE ER 30 MG PO TB24
120.0000 mg | ORAL_TABLET | Freq: Every day | ORAL | Status: DC
  Administered 2020-12-14 – 2020-12-15 (×2): 120 mg via ORAL
  Filled 2020-12-14 (×2): qty 4

## 2020-12-14 MED ORDER — ONDANSETRON HCL 4 MG/2ML IJ SOLN
INTRAMUSCULAR | Status: AC
  Filled 2020-12-14: qty 2

## 2020-12-14 MED ORDER — PREGABALIN 50 MG PO CAPS
ORAL_CAPSULE | ORAL | Status: AC
  Administered 2020-12-14: 100 mg via ORAL
  Filled 2020-12-14: qty 2

## 2020-12-14 MED ORDER — ONDANSETRON HCL 4 MG/2ML IJ SOLN: 4.0000 mg | Freq: Four times a day (QID) | INTRAMUSCULAR | Status: DC | PRN

## 2020-12-14 MED ORDER — SODIUM CHLORIDE 0.9 % IV SOLN: INTRAVENOUS | Status: DC

## 2020-12-14 MED ORDER — SODIUM CHLORIDE 0.9 % IV SOLN
INTRAVENOUS | Status: DC | PRN
  Administered 2020-12-14: 20 ug/min via INTRAVENOUS

## 2020-12-14 MED ORDER — ACETAMINOPHEN 500 MG PO TABS
1000.0000 mg | ORAL_TABLET | Freq: Four times a day (QID) | ORAL | Status: DC
  Administered 2020-12-14: 1000 mg via ORAL
  Filled 2020-12-14: qty 2

## 2020-12-14 MED ORDER — ORAL CARE MOUTH RINSE: 15.0000 mL | Freq: Once | OROMUCOSAL | Status: AC

## 2020-12-14 MED ORDER — ONDANSETRON 4 MG PO TBDP: 4.0000 mg | ORAL_TABLET | Freq: Four times a day (QID) | ORAL | Status: DC | PRN

## 2020-12-14 MED ORDER — DEXAMETHASONE SODIUM PHOSPHATE 10 MG/ML IJ SOLN
INTRAMUSCULAR | Status: AC
  Filled 2020-12-14: qty 1

## 2020-12-14 MED ORDER — SUCCINYLCHOLINE CHLORIDE 20 MG/ML IJ SOLN
INTRAMUSCULAR | Status: DC | PRN
  Administered 2020-12-14: 100 mg via INTRAVENOUS

## 2020-12-14 MED ORDER — GABAPENTIN 300 MG PO CAPS
ORAL_CAPSULE | ORAL | Status: AC
  Administered 2020-12-14: 300 mg via ORAL
  Filled 2020-12-14: qty 1

## 2020-12-14 MED ORDER — BUPIVACAINE LIPOSOME 1.3 % IJ SUSP
INTRAMUSCULAR | Status: AC
  Filled 2020-12-14: qty 20

## 2020-12-14 MED ORDER — CELECOXIB 200 MG PO CAPS: 200.0000 mg | ORAL_CAPSULE | ORAL | Status: AC

## 2020-12-14 MED ORDER — DEXAMETHASONE SODIUM PHOSPHATE 10 MG/ML IJ SOLN
INTRAMUSCULAR | Status: DC | PRN
  Administered 2020-12-14: 5 mg via INTRAVENOUS

## 2020-12-14 MED ORDER — CHLORHEXIDINE GLUCONATE CLOTH 2 % EX PADS
6.0000 | MEDICATED_PAD | Freq: Once | CUTANEOUS | Status: AC
  Administered 2020-12-14: 6 via TOPICAL

## 2020-12-14 MED ORDER — PROCHLORPERAZINE EDISYLATE 10 MG/2ML IJ SOLN: 5.0000 mg | Freq: Four times a day (QID) | INTRAMUSCULAR | Status: DC | PRN

## 2020-12-14 MED ORDER — LIDOCAINE HCL (PF) 2 % IJ SOLN
INTRAMUSCULAR | Status: AC
  Filled 2020-12-14: qty 5

## 2020-12-14 MED ORDER — DIPHENHYDRAMINE HCL 50 MG/ML IJ SOLN: 12.5000 mg | Freq: Four times a day (QID) | INTRAMUSCULAR | Status: DC | PRN

## 2020-12-14 MED ORDER — CEFAZOLIN SODIUM-DEXTROSE 2-4 GM/100ML-% IV SOLN
INTRAVENOUS | Status: AC
  Filled 2020-12-14: qty 100

## 2020-12-14 MED ORDER — KETAMINE HCL 10 MG/ML IJ SOLN
INTRAMUSCULAR | Status: DC | PRN
  Administered 2020-12-14 (×2): 10 mg via INTRAVENOUS

## 2020-12-14 MED ORDER — GLIPIZIDE ER 10 MG PO TB24
10.0000 mg | ORAL_TABLET | Freq: Every day | ORAL | Status: DC
  Administered 2020-12-14 – 2020-12-15 (×2): 10 mg via ORAL
  Filled 2020-12-14 (×3): qty 1

## 2020-12-14 MED ORDER — KETAMINE HCL 50 MG/5ML IJ SOSY
PREFILLED_SYRINGE | INTRAMUSCULAR | Status: AC
  Filled 2020-12-14: qty 5

## 2020-12-14 MED ORDER — KETOROLAC TROMETHAMINE 15 MG/ML IJ SOLN
15.0000 mg | Freq: Four times a day (QID) | INTRAMUSCULAR | Status: DC
  Administered 2020-12-14 – 2020-12-15 (×2): 15 mg via INTRAVENOUS
  Filled 2020-12-14 (×2): qty 1

## 2020-12-14 MED ORDER — FENTANYL CITRATE (PF) 100 MCG/2ML IJ SOLN
INTRAMUSCULAR | Status: AC
  Administered 2020-12-14: 25 ug via INTRAVENOUS
  Filled 2020-12-14: qty 2

## 2020-12-14 MED ORDER — PREGABALIN 50 MG PO CAPS
100.0000 mg | ORAL_CAPSULE | Freq: Three times a day (TID) | ORAL | Status: DC
  Administered 2020-12-14 – 2020-12-15 (×3): 100 mg via ORAL
  Filled 2020-12-14 (×4): qty 2

## 2020-12-14 MED ORDER — BUPIVACAINE LIPOSOME 1.3 % IJ SUSP
INTRAMUSCULAR | Status: DC | PRN
  Administered 2020-12-14: 20 mL

## 2020-12-14 MED ORDER — PROCHLORPERAZINE MALEATE 10 MG PO TABS
10.0000 mg | ORAL_TABLET | Freq: Four times a day (QID) | ORAL | Status: DC | PRN
  Filled 2020-12-14: qty 1

## 2020-12-14 MED ORDER — LOSARTAN POTASSIUM 50 MG PO TABS
50.0000 mg | ORAL_TABLET | Freq: Every day | ORAL | Status: DC
  Administered 2020-12-14 – 2020-12-15 (×2): 50 mg via ORAL
  Filled 2020-12-14 (×2): qty 1

## 2020-12-14 MED ORDER — INSULIN ASPART 100 UNIT/ML ~~LOC~~ SOLN
5.0000 [IU] | Freq: Once | SUBCUTANEOUS | Status: AC
  Administered 2020-12-14: 5 [IU] via SUBCUTANEOUS

## 2020-12-14 MED ORDER — PREGABALIN 100 MG PO CAPS: 100.0000 mg | ORAL_CAPSULE | Freq: Once | ORAL | Status: AC

## 2020-12-14 MED ORDER — ROCURONIUM BROMIDE 100 MG/10ML IV SOLN
INTRAVENOUS | Status: DC | PRN
  Administered 2020-12-14: 50 mg via INTRAVENOUS
  Administered 2020-12-14: 10 mg via INTRAVENOUS

## 2020-12-14 MED ORDER — HEPARIN SODIUM (PORCINE) 5000 UNIT/ML IJ SOLN
INTRAMUSCULAR | Status: AC
  Administered 2020-12-14: 5000 [IU] via SUBCUTANEOUS
  Filled 2020-12-14: qty 1

## 2020-12-14 MED ORDER — LIDOCAINE HCL (CARDIAC) PF 100 MG/5ML IV SOSY
PREFILLED_SYRINGE | INTRAVENOUS | Status: DC | PRN
  Administered 2020-12-14: 100 mg via INTRAVENOUS

## 2020-12-14 MED ORDER — FENTANYL CITRATE (PF) 100 MCG/2ML IJ SOLN
INTRAMUSCULAR | Status: AC
  Filled 2020-12-14: qty 2

## 2020-12-14 MED ORDER — CHLORHEXIDINE GLUCONATE 0.12 % MT SOLN: 15.0000 mL | Freq: Once | OROMUCOSAL | Status: AC

## 2020-12-14 MED ORDER — DIPHENHYDRAMINE HCL 12.5 MG/5ML PO ELIX
12.5000 mg | ORAL_SOLUTION | Freq: Four times a day (QID) | ORAL | Status: DC | PRN
  Filled 2020-12-14: qty 5

## 2020-12-14 MED ORDER — MORPHINE SULFATE (PF) 2 MG/ML IV SOLN
2.0000 mg | INTRAVENOUS | Status: DC | PRN
  Administered 2020-12-14: 2 mg via INTRAVENOUS
  Filled 2020-12-14: qty 1

## 2020-12-14 MED ORDER — BUPIVACAINE-EPINEPHRINE 0.25% -1:200000 IJ SOLN
INTRAMUSCULAR | Status: DC | PRN
  Administered 2020-12-14: 30 mL

## 2020-12-14 MED ORDER — HYDROCHLOROTHIAZIDE 12.5 MG PO CAPS
12.5000 mg | ORAL_CAPSULE | Freq: Every day | ORAL | Status: DC
  Administered 2020-12-14 – 2020-12-15 (×2): 12.5 mg via ORAL
  Filled 2020-12-14 (×2): qty 1

## 2020-12-14 MED ORDER — CEFAZOLIN SODIUM-DEXTROSE 2-4 GM/100ML-% IV SOLN
2.0000 g | INTRAVENOUS | Status: AC
  Administered 2020-12-14: 2 g via INTRAVENOUS

## 2020-12-14 MED ORDER — INSULIN ASPART 100 UNIT/ML ~~LOC~~ SOLN: 3.0000 [IU] | Freq: Three times a day (TID) | SUBCUTANEOUS | Status: DC

## 2020-12-14 MED ORDER — SODIUM CHLORIDE 0.9 % IV SOLN: INTRAVENOUS | Status: DC | PRN

## 2020-12-14 MED ORDER — HYDROMORPHONE HCL 1 MG/ML IJ SOLN
INTRAMUSCULAR | Status: AC
  Filled 2020-12-14: qty 1

## 2020-12-14 MED ORDER — SUGAMMADEX SODIUM 200 MG/2ML IV SOLN
INTRAVENOUS | Status: DC | PRN
  Administered 2020-12-14: 200 mg via INTRAVENOUS

## 2020-12-14 MED ORDER — ONDANSETRON HCL 4 MG/2ML IJ SOLN
INTRAMUSCULAR | Status: DC | PRN
  Administered 2020-12-14: 4 mg via INTRAVENOUS

## 2020-12-14 MED ORDER — INSULIN ASPART 100 UNIT/ML ~~LOC~~ SOLN
SUBCUTANEOUS | Status: AC
  Filled 2020-12-14: qty 1

## 2020-12-14 MED ORDER — KETOROLAC TROMETHAMINE 15 MG/ML IJ SOLN
15.0000 mg | Freq: Four times a day (QID) | INTRAMUSCULAR | Status: DC
  Administered 2020-12-14: 15 mg via INTRAVENOUS
  Filled 2020-12-14: qty 1

## 2020-12-14 MED ORDER — METHOCARBAMOL 500 MG PO TABS: 500.0000 mg | ORAL_TABLET | Freq: Four times a day (QID) | ORAL | Status: DC | PRN

## 2020-12-14 MED ORDER — EPHEDRINE SULFATE 50 MG/ML IJ SOLN
INTRAMUSCULAR | Status: DC | PRN
  Administered 2020-12-14: 5 mg via INTRAVENOUS

## 2020-12-14 MED ORDER — ACETAMINOPHEN 500 MG PO TABS: 1000.0000 mg | ORAL_TABLET | ORAL | Status: AC

## 2020-12-14 MED ORDER — CEFAZOLIN SODIUM-DEXTROSE 2-4 GM/100ML-% IV SOLN
2.0000 g | Freq: Three times a day (TID) | INTRAVENOUS | Status: AC
  Administered 2020-12-14 (×2): 2 g via INTRAVENOUS
  Filled 2020-12-14 (×2): qty 100

## 2020-12-14 MED ORDER — CHLORHEXIDINE GLUCONATE 0.12 % MT SOLN
OROMUCOSAL | Status: AC
  Administered 2020-12-14: 15 mL via OROMUCOSAL
  Filled 2020-12-14: qty 15

## 2020-12-14 MED ORDER — OXYCODONE HCL 5 MG PO TABS
5.0000 mg | ORAL_TABLET | ORAL | Status: DC | PRN
  Administered 2020-12-14: 10 mg via ORAL
  Filled 2020-12-14: qty 2

## 2020-12-14 MED ORDER — FENTANYL CITRATE (PF) 100 MCG/2ML IJ SOLN
INTRAMUSCULAR | Status: DC | PRN
  Administered 2020-12-14 (×2): 50 ug via INTRAVENOUS

## 2020-12-14 MED ORDER — PROPOFOL 10 MG/ML IV BOLUS
INTRAVENOUS | Status: DC | PRN
  Administered 2020-12-14: 120 mg via INTRAVENOUS

## 2020-12-14 MED ORDER — CELECOXIB 200 MG PO CAPS
ORAL_CAPSULE | ORAL | Status: AC
  Administered 2020-12-14: 200 mg via ORAL
  Filled 2020-12-14: qty 1

## 2020-12-14 MED ORDER — ROCURONIUM BROMIDE 10 MG/ML (PF) SYRINGE
PREFILLED_SYRINGE | INTRAVENOUS | Status: AC
  Filled 2020-12-14: qty 10

## 2020-12-14 MED ORDER — INSULIN ASPART 100 UNIT/ML ~~LOC~~ SOLN
0.0000 [IU] | Freq: Three times a day (TID) | SUBCUTANEOUS | Status: DC
  Filled 2020-12-14: qty 1

## 2020-12-14 MED ORDER — BUPIVACAINE-EPINEPHRINE (PF) 0.25% -1:200000 IJ SOLN
INTRAMUSCULAR | Status: AC
  Filled 2020-12-14: qty 30

## 2020-12-14 MED ORDER — VISTASEAL 10 ML SINGLE DOSE KIT
PACK | CUTANEOUS | Status: AC
  Filled 2020-12-14: qty 10

## 2020-12-14 MED ORDER — FENTANYL CITRATE (PF) 100 MCG/2ML IJ SOLN
25.0000 ug | INTRAMUSCULAR | Status: DC | PRN
  Administered 2020-12-14 (×2): 25 ug via INTRAVENOUS

## 2020-12-14 MED ORDER — EPHEDRINE 5 MG/ML INJ
INTRAVENOUS | Status: AC
  Filled 2020-12-14: qty 10

## 2020-12-14 MED ORDER — FLUTICASONE PROPIONATE 50 MCG/ACT NA SUSP
1.0000 | Freq: Every day | NASAL | Status: DC | PRN
  Filled 2020-12-14: qty 16

## 2020-12-14 MED ORDER — ENOXAPARIN SODIUM 40 MG/0.4ML ~~LOC~~ SOLN
40.0000 mg | SUBCUTANEOUS | Status: DC
  Administered 2020-12-15: 40 mg via SUBCUTANEOUS
  Filled 2020-12-14: qty 0.4

## 2020-12-14 MED ORDER — VISTASEAL 10 ML SINGLE DOSE KIT
PACK | CUTANEOUS | Status: DC | PRN
  Administered 2020-12-14: 10 mL via TOPICAL

## 2020-12-14 MED ORDER — GLYCOPYRROLATE 0.2 MG/ML IJ SOLN
INTRAMUSCULAR | Status: DC | PRN
  Administered 2020-12-14: .2 mg via INTRAVENOUS

## 2020-12-14 MED ORDER — ONDANSETRON HCL 4 MG/2ML IJ SOLN: 4.0000 mg | Freq: Once | INTRAMUSCULAR | Status: DC | PRN

## 2020-12-14 MED ORDER — ACETAMINOPHEN 500 MG PO TABS
ORAL_TABLET | ORAL | Status: AC
  Administered 2020-12-14: 1000 mg via ORAL
  Filled 2020-12-14: qty 2

## 2020-12-14 MED ORDER — TRAMADOL HCL 50 MG PO TABS: 50.0000 mg | ORAL_TABLET | Freq: Four times a day (QID) | ORAL | Status: DC | PRN

## 2020-12-14 MED ORDER — DEXMEDETOMIDINE (PRECEDEX) IN NS 20 MCG/5ML (4 MCG/ML) IV SYRINGE
PREFILLED_SYRINGE | INTRAVENOUS | Status: DC | PRN
  Administered 2020-12-14 (×2): 8 ug via INTRAVENOUS

## 2020-12-14 MED ORDER — GABAPENTIN 300 MG PO CAPS: 300.0000 mg | ORAL_CAPSULE | ORAL | Status: AC

## 2020-12-14 MED ORDER — PROPOFOL 10 MG/ML IV BOLUS
INTRAVENOUS | Status: AC
  Filled 2020-12-14: qty 20

## 2020-12-14 MED ORDER — PHENYLEPHRINE HCL (PRESSORS) 10 MG/ML IV SOLN
INTRAVENOUS | Status: AC
  Filled 2020-12-14: qty 1

## 2020-12-14 SURGICAL SUPPLY — 57 items
APPLICATOR VISTASEAL 35 (MISCELLANEOUS) ×2 IMPLANT
CANISTER SUCT 1200ML W/VALVE (MISCELLANEOUS) IMPLANT
CANNULA REDUC XI 12-8 STAPL (CANNULA) ×2
CANNULA REDUCER 12-8 DVNC XI (CANNULA) ×1 IMPLANT
CHLORAPREP W/TINT 26 (MISCELLANEOUS) ×2 IMPLANT
COVER WAND RF STERILE (DRAPES) ×2 IMPLANT
DECANTER SPIKE VIAL GLASS SM (MISCELLANEOUS) ×2 IMPLANT
DEFOGGER SCOPE WARMER CLEARIFY (MISCELLANEOUS) ×2 IMPLANT
DERMABOND ADVANCED (GAUZE/BANDAGES/DRESSINGS) ×1
DERMABOND ADVANCED .7 DNX12 (GAUZE/BANDAGES/DRESSINGS) ×1 IMPLANT
DRAPE 3/4 80X56 (DRAPES) IMPLANT
DRAPE ARM DVNC X/XI (DISPOSABLE) ×4 IMPLANT
DRAPE COLUMN DVNC XI (DISPOSABLE) ×1 IMPLANT
DRAPE DA VINCI XI ARM (DISPOSABLE) ×8
DRAPE DA VINCI XI COLUMN (DISPOSABLE) ×2
ELECT CAUTERY BLADE 6.4 (BLADE) ×2 IMPLANT
ELECT REM PT RETURN 9FT ADLT (ELECTROSURGICAL) ×2
ELECTRODE REM PT RTRN 9FT ADLT (ELECTROSURGICAL) ×1 IMPLANT
GLOVE SURG ENC MOIS LTX SZ7 (GLOVE) ×6 IMPLANT
GOWN STRL REUS W/ TWL LRG LVL3 (GOWN DISPOSABLE) ×4 IMPLANT
GOWN STRL REUS W/TWL LRG LVL3 (GOWN DISPOSABLE) ×8
GRASPER LAPSCPC 5X45 DSP (INSTRUMENTS) ×2 IMPLANT
IRRIGATION STRYKERFLOW (MISCELLANEOUS) IMPLANT
IRRIGATOR STRYKERFLOW (MISCELLANEOUS)
IV NS 1000ML (IV SOLUTION)
IV NS 1000ML BAXH (IV SOLUTION) IMPLANT
KIT PINK PAD W/HEAD ARE REST (MISCELLANEOUS) ×2
KIT PINK PAD W/HEAD ARM REST (MISCELLANEOUS) ×1 IMPLANT
KIT TURNOVER CYSTO (KITS) ×2 IMPLANT
LABEL OR SOLS (LABEL) ×2 IMPLANT
MANIFOLD NEPTUNE II (INSTRUMENTS) ×2 IMPLANT
MESH BIO-A 7X10 SYN MAT (Mesh General) ×2 IMPLANT
NEEDLE HYPO 22GX1.5 SAFETY (NEEDLE) ×2 IMPLANT
OBTURATOR OPTICAL STANDARD 8MM (TROCAR) ×2
OBTURATOR OPTICAL STND 8 DVNC (TROCAR) ×1
OBTURATOR OPTICALSTD 8 DVNC (TROCAR) ×1 IMPLANT
PACK LAP CHOLECYSTECTOMY (MISCELLANEOUS) ×2 IMPLANT
PENCIL ELECTRO HAND CTR (MISCELLANEOUS) ×2 IMPLANT
SEAL CANN UNIV 5-8 DVNC XI (MISCELLANEOUS) ×3 IMPLANT
SEAL XI 5MM-8MM UNIVERSAL (MISCELLANEOUS) ×6
SEALER VESSEL DA VINCI XI (MISCELLANEOUS) ×2
SEALER VESSEL EXT DVNC XI (MISCELLANEOUS) ×1 IMPLANT
SOLUTION ELECTROLUBE (MISCELLANEOUS) ×2 IMPLANT
SPONGE LAP 18X18 RF (DISPOSABLE) ×2 IMPLANT
STAPLER CANNULA SEAL DVNC XI (STAPLE) ×1 IMPLANT
STAPLER CANNULA SEAL XI (STAPLE) ×2
SUT MNCRL 4-0 (SUTURE) ×2
SUT MNCRL 4-0 27XMFL (SUTURE) ×1
SUT SILK 2 0 SH (SUTURE) ×6 IMPLANT
SUT VICRYL 0 AB UR-6 (SUTURE) ×4 IMPLANT
SUT VLOC 90 S/L VL9 GS22 (SUTURE) ×2 IMPLANT
SUTURE MNCRL 4-0 27XMF (SUTURE) ×1 IMPLANT
TAPE TRANSPORE STRL 2 31045 (GAUZE/BANDAGES/DRESSINGS) ×2 IMPLANT
TRAY FOLEY SLVR 16FR LF STAT (SET/KITS/TRAYS/PACK) ×2 IMPLANT
TROCAR BALLN GELPORT 12X130M (ENDOMECHANICALS) ×2 IMPLANT
TROCAR XCEL NON-BLD 5MMX100MML (ENDOMECHANICALS) ×2 IMPLANT
TUBING EVAC SMOKE HEATED PNEUM (TUBING) ×2 IMPLANT

## 2020-12-14 NOTE — Op Note (Signed)
Robotic assisted laparoscopic Repair of paraesophageal hernia with Mesh and  Nissen fundoplication    Pre-operative Diagnosis: GERD, hiatal hernia  Post-operative Diagnosis: same  Procedure:  Robotic assisted laparoscopic Repair of paraesophageal hernia with Mesh ( Bio-A 10x7cms) and  Nissen fundoplication   Surgeon: Caroleen Hamman, MD FACS  Assistant: Floyce Stakes, RNFA Required due to the complexity of the case the need for exposure and lack of first assist.  Anesthesia: Gen. with endotracheal tube  Findings: Type III paraesophageal hernia Loose wrap 360 degree over 50 FR Bougie Healthy esophagus with strong muscle fibers  Estimated Blood Loss: 60VP            Complications: none   Procedure Details  The patient was seen again in the Holding Room. The benefits, complications, treatment options, and expected outcomes were discussed with the patient. The risks of bleeding, infection, recurrence of symptoms, failure to resolve symptoms,  esophageal damage, Dysphagia, bowel injury, any of which could require further surgery were reviewed with the patient. The likelihood of improving the patient's symptoms with return to their baseline status is good.  The patient and/or family concurred with the proposed plan, giving informed consent.  The patient was taken to Operating Room, identified  and the procedure verified.  A Time Out was held and the above information confirmed.  Prior to the induction of general anesthesia, antibiotic prophylaxis was administered. VTE prophylaxis was in place. General endotracheal anesthesia was then administered and tolerated well. After the induction, the abdomen was prepped with Chloraprep and draped in the sterile fashion. The patient was positioned in the supine position.  Cut down technique was used to enter the abdominal cavity and a Hasson trochar was placed after two vicryl stitches were anchored to the fascia. Pneumoperitoneum was then created with  CO2 and tolerated well without any adverse changes in the patient's vital signs.  Three 8-mm ports were placed under direct vision. All skin incisions  were infiltrated with a local anesthetic agent before making the incision and placing the trocars. An additional 5 mm regular laparoscopic port was placed to assist with retraction and exposure.   The patient was positioned  in reverse Trendelenburg, robot was brought to the surgical field and docked in the standard fashion.  We made sure all the instrumentation was kept indirect view at all times and that there were no collision between the arms. I scrubbed out and went to the console.  I used robtic arm to retract the liver, the vessel sealer on my right hand and a forced bipolar grasper on my left hand.  There is along the extra 5 mm port allow me ample exposure and the ability to perform meticulous dissection  We Started dividing the lesser omentum via the pars flaccida.  We Were able to dissect the lesser curvature of the stomach and  dissected the fundus free from the right and left crus.  We circumferentially dissected the GE junction.  The hernia sac was also completely reduced and we were able to bring the stomach into the intra-abdominal position.  Attention then was turned to the greater curvature where the short gastrics were divided with sealer device.  We were able to identify the left crus and again were able to make sure there was a good circumferential dissection and that the hernia sac was completely excised.  We did perform also a good dissection within the mediastinum to allow a complete reduction of the sac and a to completely allow an intra-abdominal Nissen  fundoplication.  2-0V lock suture was inserted and the crus as well as the hernia was closed with a running suture , I used two longitudinal pieces of BioA as a Pledged. 10x7cm Bio-A mesh used and placed posterior to the esophagus and secured with vistaseal.  We Asked anesthesia to  place a 50 French bougie and this went easily.  We also observe trajectory of the bougie. 360 degree Nissen fundoplication was created with multiple 2-0 silk sutures and we placed 3 stitches taking some of the esophagus within that bite.  The fundoplication measured approximately 3-1/2 cm and he was floppy. I was very happy with the way the fundoplication laid and the repair of the hernia.  Inspection of the  upper quadrant was performed. No bleeding, bile  Or esophageal injuries leaks, or bowel injuries were noted. Robotic instruments and robotic arms were undocked in the standard fashion. All the needles were removed under direct visualization.   I scrubbed back in.  Pneumoperitoneum was released.  The periumbilical port site was closed with interrumpted 0 Vicryl sutures. 4-0 subcuticular Monocryl was used to close the skin. Liposomal marcaine was injected to all the incisions sites.  Dermabond was  applied.  The patient was then extubated and brought to the recovery room in stable condition. Sponge, lap, and needle counts were correct at closure and at the conclusion of the case.               Caroleen Hamman, MD, FACS

## 2020-12-14 NOTE — Anesthesia Preprocedure Evaluation (Signed)
Anesthesia Evaluation  Patient identified by MRN, date of birth, ID band Patient awake    Reviewed: Allergy & Precautions, H&P , NPO status , Patient's Chart, lab work & pertinent test results, reviewed documented beta blocker date and time   Airway Mallampati: II  TM Distance: >3 FB Neck ROM: full  Mouth opening: Limited Mouth Opening  Dental  (+) Teeth Intact   Pulmonary neg pulmonary ROS,    Pulmonary exam normal        Cardiovascular Exercise Tolerance: Poor hypertension, On Medications + CAD  Normal cardiovascular exam Rhythm:regular Rate:Normal  Followed by Humphrey Rolls and stable pattern cardiac symptoms    Neuro/Psych  Neuromuscular disease negative psych ROS   GI/Hepatic Neg liver ROS, hiatal hernia, GERD  Medicated,  Endo/Other  negative endocrine ROSdiabetes, Well Controlled, Type 2, Oral Hypoglycemic Agents  Renal/GU Renal disease  negative genitourinary   Musculoskeletal   Abdominal   Peds  Hematology negative hematology ROS (+)   Anesthesia Other Findings Past Medical History: No date: CAD (coronary artery disease) No date: Cancer ([place])     Comment:  skin No date: Chronic kidney disease No date: Diabetes mellitus without complication (HCC) No date: Fibromyalgia No date: GERD (gastroesophageal reflux disease) No date: History of hiatal hernia No date: Hypertension No date: Osteoarthritis No date: Pulmonary fibrosis ([place]) Past Surgical History: No date: ABDOMINAL HYSTERECTOMY No date: BREAST BIOPSY; Right     Comment:  neg No date: COLONOSCOPY No date: ENDOSCOPY No date: TONSILLECTOMY   Reproductive/Obstetrics negative OB ROS                             Anesthesia Physical Anesthesia Plan  ASA: III  Anesthesia Plan: General ETT   Post-op Pain Management:    Induction:   PONV Risk Score and Plan: 4 or greater  Airway Management Planned:   Additional Equipment:    Intra-op Plan:   Post-operative Plan:   Informed Consent: I have reviewed the patients History and Physical, chart, labs and discussed the procedure including the risks, benefits and alternatives for the proposed anesthesia with the patient or authorized representative who has indicated his/her understanding and acceptance.     Dental Advisory Given  Plan Discussed with: CRNA  Anesthesia Plan Comments:         Anesthesia Quick Evaluation

## 2020-12-14 NOTE — Interval H&P Note (Signed)
History and Physical Interval Note:  12/14/2020 7:19 AM  Claudia Schaefer  has presented today for surgery, with the diagnosis of paraesophageal hernia.  The various methods of treatment have been discussed with the patient and family. After consideration of risks, benefits and other options for treatment, the patient has consented to  Procedure(s): XI ROBOTIC ASSISTED PARAESOPHAGEAL HERNIA REPAIR, RNFA to assist (N/A) as a surgical intervention.  The patient's history has been reviewed, patient examined, no change in status, stable for surgery.  I have reviewed the patient's chart and labs.  Questions were answered to the patient's satisfaction.     [place]

## 2020-12-14 NOTE — Consult Note (Signed)
Claudia Schaefer is a 80 y.o. female  [id number]  Primary Cardiologist: Neoma Laming Reason for Consultation: Chest pain  HPI: 80 year old white female with past medical history of hypertension diabetes and CAD was seen in recovery room because of chest tightness and evaluation of EKG.  Patient had hernia surgery and I was asked to evaluate the patient because of chest tightness.  She has occasional chest tightness prior to surgery and is on isosorbide mononitrate.   Review of Systems: No orthopnea PND or leg swelling   Past Medical History:  Diagnosis Date  . CAD (coronary artery disease)   . Cancer ([place])    skin  . Chronic kidney disease   . Diabetes mellitus without complication ([place])   . Fibromyalgia   . GERD (gastroesophageal reflux disease)   . History of hiatal hernia   . Hypertension   . Osteoarthritis   . Pulmonary fibrosis ([place])     Medications Prior to Admission  Medication Sig Dispense Refill  . atorvastatin (LIPITOR) 40 MG tablet Take 40 mg by mouth at bedtime.    . fluticasone (FLONASE) 50 MCG/ACT nasal spray Place 1 spray into both nostrils daily as needed for allergies or rhinitis.    Marland Kitchen glipiZIDE (GLUCOTROL XL) 10 MG 24 hr tablet Take 10 mg by mouth daily.    . isosorbide mononitrate (IMDUR) 120 MG 24 hr tablet Take 120 mg by mouth daily.     Marland Kitchen losartan (COZAAR) 50 MG tablet Take 50 mg by mouth daily.    Marland Kitchen Lysine 500 MG CAPS Take 500 mg by mouth daily.    . Magnesium 500 MG TABS Take 500 mg by mouth daily.    . meloxicam (MOBIC) 7.5 MG tablet Take 7.5 mg by mouth daily.    . metFORMIN (GLUCOPHAGE) 1000 MG tablet Take 1,000 mg by mouth daily with breakfast.    . pantoprazole (PROTONIX) 40 MG tablet Take 40 mg by mouth 2 (two) times daily.    . sucralfate (CARAFATE) 1 g tablet Take 1 g by mouth 2 (two) times daily.    . traMADol (ULTRAM) 50 MG tablet Take 50 mg by mouth at bedtime as needed for moderate pain.    . Vitamin A 2400 MCG (8000 UT) CAPS Take  8,000 Units by mouth daily.    . vitamin B-12 (CYANOCOBALAMIN) 1000 MCG tablet Take 1,000 mcg by mouth daily.       . insulin aspart        Infusions: . sodium chloride 10 mL/hr at 12/14/20 0630  . sodium chloride      Allergies  Allergen Reactions  . Penicillins Rash    Did it involve swelling of the face/tongue/throat, SOB, or low BP? No Did it involve sudden or severe rash/hives, skin peeling, or any reaction on the inside of your mouth or nose? Yes Did you need to seek medical attention at a hospital or doctor's office? No When did it last happen? If all above answers are "NO", may proceed with cephalosporin use.  . Sulfa Antibiotics Rash and Other (See Comments)    Social History   Socioeconomic History  . Marital status: Single    Spouse name: Not on file  . Number of children: Not on file  . Years of education: Not on file  . Highest education level: Not on file  Occupational History  . Not on file  Tobacco Use  . Smoking status: Never Smoker  . Smokeless tobacco: Never Used  Vaping  Use  . Vaping Use: Never used  Substance and Sexual Activity  . Alcohol use: Yes    Comment: rare  . Drug use: No  . Sexual activity: Not on file  Other Topics Concern  . Not on file  Social History Narrative  . Not on file   Social Determinants of Health   Financial Resource Strain: Not on file  Food Insecurity: Not on file  Transportation Needs: Not on file  Physical Activity: Not on file  Stress: Not on file  Social Connections: Not on file  Intimate Partner Violence: Not on file    Family History  Problem Relation Age of Onset  . Breast cancer Neg Hx     PHYSICAL EXAM: Vitals:   12/14/20 1145 12/14/20 1200  BP: (!) 172/84 (!) 171/89  Pulse: 74 89  Resp: 20 (!) 21  Temp:    SpO2: 100% 100%     Intake/Output Summary (Last 24 hours) at 12/14/2020 1209 Last data filed at 12/14/2020 1008 Gross per 24 hour  Intake 500 ml  Output 120 ml  Net 380 ml     General:  Well appearing. No respiratory difficulty HEENT: normal Neck: supple. no JVD. Carotids 2+ bilat; no bruits. No lymphadenopathy or thryomegaly appreciated. Cor: PMI nondisplaced. Regular rate & rhythm. No rubs, gallops or murmurs. Lungs: clear Abdomen: soft, nontender, nondistended. No hepatosplenomegaly. No bruits or masses. Good bowel sounds. Extremities: no cyanosis, clubbing, rash, edema Neuro: alert & oriented x 3, cranial nerves grossly intact. moves all 4 extremities w/o difficulty. Affect pleasant.  ECG: Normal sinus rhythm 94 bpm with nonspecific ST-T changes and EKG is unchanged from 04/13/2020  Results for orders placed or performed during the hospital encounter of 12/14/20 (from the past 24 hour(s))  Glucose, capillary     Status: Abnormal   Collection Time: 12/14/20  6:15 AM  Result Value Ref Range   Glucose-Capillary 132 (H) 70 - 99 mg/dL  Glucose, capillary     Status: Abnormal   Collection Time: 12/14/20 10:19 AM  Result Value Ref Range   Glucose-Capillary 249 (H) 70 - 99 mg/dL   No results found.   ASSESSMENT AND PLAN: Atypical chest pain most likely related to anesthesia with history of coronary artery disease is reasonable to observe the patient and do at least 1 or 2 sets of cardiac enzymes.  Patient can go home once everything is negative follow-up in the office thank you very much for referral.  Tali Coster A

## 2020-12-14 NOTE — Transfer of Care (Signed)
Immediate Anesthesia Transfer of Care Note  Patient: Claudia Schaefer  Procedure(s) Performed: XI ROBOTIC ASSISTED PARAESOPHAGEAL HERNIA REPAIR, RNFA to assist (N/A )  Patient Location: PACU  Anesthesia Type:General  Level of Consciousness: drowsy and patient cooperative  Airway & Oxygen Therapy: Patient Spontanous Breathing and Patient connected to face mask  Post-op Assessment: Report given to RN, Post -op Vital signs reviewed and stable and Patient moving all extremities X 4  Post vital signs: Reviewed and stable  Last Vitals:  Vitals Value Taken Time  BP 168/84 12/14/20 1016  Temp 36.1 C 12/14/20 1016  Pulse 98 12/14/20 1020  Resp 14 12/14/20 1020  SpO2 100 % 12/14/20 1020  Vitals shown include unvalidated device data.  Last Pain:  Vitals:   12/14/20 0619  TempSrc: Temporal  PainSc: 5          Complications: No complications documented.

## 2020-12-14 NOTE — Progress Notes (Signed)
Patient had critical troponin level of 248, MD notified per secure chat, awaiting response and further orders. Assessed the patient, pain 8/0, assisted to the bathroom, vitals taken, & repeat Troponin to be collected.

## 2020-12-14 NOTE — Anesthesia Procedure Notes (Addendum)
Procedure Name: Intubation Date/Time: 12/14/2020 7:40 AM Performed by: Joellyn Quails, RN Pre-anesthesia Checklist: Patient identified, Emergency Drugs available, Suction available and Patient being monitored Patient Re-evaluated:Patient Re-evaluated prior to induction Oxygen Delivery Method: Circle system utilized Preoxygenation: Pre-oxygenation with 100% oxygen Induction Type: IV induction Ventilation: Mask ventilation without difficulty Laryngoscope Size: McGraph and 3 Grade View: Grade I Tube type: Oral Tube size: 6.5 mm Number of attempts: 1 Airway Equipment and Method: Stylet,  Oral airway and Video-laryngoscopy Placement Confirmation: ETT inserted through vocal cords under direct vision,  positive ETCO2 and breath sounds checked- equal and bilateral Secured at: 20 cm Tube secured with: Tape Dental Injury: Teeth and Oropharynx as per pre-operative assessment

## 2020-12-15 ENCOUNTER — Encounter: Payer: Self-pay | Admitting: Surgery

## 2020-12-15 DIAGNOSIS — K449 Diaphragmatic hernia without obstruction or gangrene: Secondary | ICD-10-CM | POA: Diagnosis not present

## 2020-12-15 LAB — CBC
HCT: 29.9 % — ABNORMAL LOW (ref 36.0–46.0)
Hemoglobin: 9.9 g/dL — ABNORMAL LOW (ref 12.0–15.0)
MCH: 31.6 pg (ref 26.0–34.0)
MCHC: 33.1 g/dL (ref 30.0–36.0)
MCV: 95.5 fL (ref 80.0–100.0)
Platelets: 186 10*3/uL (ref 150–400)
RBC: 3.13 MIL/uL — ABNORMAL LOW (ref 3.87–5.11)
RDW: 13.9 % (ref 11.5–15.5)
WBC: 9.2 10*3/uL (ref 4.0–10.5)
nRBC: 0 % (ref 0.0–0.2)

## 2020-12-15 LAB — GLUCOSE, CAPILLARY
Glucose-Capillary: 129 mg/dL — ABNORMAL HIGH (ref 70–99)
Glucose-Capillary: 179 mg/dL — ABNORMAL HIGH (ref 70–99)

## 2020-12-15 LAB — BASIC METABOLIC PANEL
Anion gap: 7 (ref 5–15)
BUN: 32 mg/dL — ABNORMAL HIGH (ref 8–23)
CO2: 22 mmol/L (ref 22–32)
Calcium: 8.2 mg/dL — ABNORMAL LOW (ref 8.9–10.3)
Chloride: 107 mmol/L (ref 98–111)
Creatinine, Ser: 1.61 mg/dL — ABNORMAL HIGH (ref 0.44–1.00)
GFR, Estimated: 33 mL/min — ABNORMAL LOW (ref >60)
Glucose, Bld: 143 mg/dL — ABNORMAL HIGH (ref 70–99)
Potassium: 3.7 mmol/L (ref 3.5–5.1)
Sodium: 136 mmol/L (ref 135–145)

## 2020-12-15 MED ORDER — IBUPROFEN 600 MG PO TABS: 600.0000 mg | ORAL_TABLET | Freq: Three times a day (TID) | ORAL | 0 refills | Status: DC | PRN

## 2020-12-15 MED ORDER — ISOSORBIDE MONONITRATE ER 60 MG PO TB24: 60.0000 mg | ORAL_TABLET | Freq: Every day | ORAL | 1 refills | Status: AC

## 2020-12-15 MED ORDER — PROPOFOL 10 MG/ML IV BOLUS
INTRAVENOUS | Status: AC
  Filled 2020-12-15: qty 40

## 2020-12-15 MED ORDER — METHOCARBAMOL 500 MG PO TABS: 500.0000 mg | ORAL_TABLET | Freq: Four times a day (QID) | ORAL | 0 refills | Status: DC | PRN

## 2020-12-15 MED ORDER — ALBUMIN HUMAN 25 % IV SOLN
12.5000 g | Freq: Once | INTRAVENOUS | Status: AC
  Administered 2020-12-15: 12.5 g via INTRAVENOUS
  Filled 2020-12-15: qty 50

## 2020-12-15 MED ORDER — OXYCODONE HCL 5 MG PO TABS: 5.0000 mg | ORAL_TABLET | Freq: Four times a day (QID) | ORAL | 0 refills | Status: DC | PRN

## 2020-12-15 NOTE — Progress Notes (Signed)
Date and time results received: 12/15/20 (lab never called to notify about results)  Test: Troponins Critical Value: 302 and 308  Name of Provider Notified: Caroleen Hamman MD  No orders at this time

## 2020-12-15 NOTE — Discharge Summary (Signed)
[facility] SURGICAL DISCHARGE SUMMARY  Patient ID: Claudia Schaefer MRN: [id number] DOB/AGE: 1941/03/07 80 y.o.  Admit date: 12/14/2020 Discharge date: 12/15/2020  Discharge Diagnoses Patient Active Problem List   Diagnosis Date Noted  . S/P repair of paraesophageal hernia 12/14/2020  . Chest pain at rest 12/12/2019  . Type 2 diabetes mellitus with stage 3 chronic kidney disease ([place]) 12/12/2019    Consultants Cardiology  Procedures 12/14/2020:  Robotic assisted paraesophageal hernia repair with Nissen fundoplication   HPI: Claudia Schaefer is a 80 y.o. female with history of symptomatic paraesophageal hernia repair who presents to [facility] on 04/05 for scheduled Robotic assisted paraesophageal hernia repair with Nissen fundoplication with Dr Dahlia Byes.   Hospital Course: Informed consent was obtained and documented, and patient underwent uneventful Robotic assisted paraesophageal hernia repair with Nissen fundoplication (Dr Dahlia Byes, [id number]).  Post-operatively, patient did complain of chest discomfort. She was found to have slight elevation in high sensitivity troponin. Cardiology was consulted and this was trended and remained stable. No further inpatient work up was warranted and she was cleared for discharge with cardiology follow up. Advancement of patient's diet and ambulation were ultimately well-tolerated. The remainder of patient's hospital course was essentially unremarkable, and discharge planning was initiated accordingly with patient safely able to be discharged home with appropriate discharge instructions, pain control, and outpatient follow-up after all of her questions were answered to her expressed satisfaction.  Please note, we did hold her Losartan at discharge with plan to restart in 3 days or so. We will also half her dose of Imdur to 60 mg. She has cardiology follow up in 1 week.   Discharge Condition: good    Allergies as of 12/15/2020      Reactions    Penicillins Rash   Did it involve swelling of the face/tongue/throat, SOB, or low BP? No Did it involve sudden or severe rash/hives, skin peeling, or any reaction on the inside of your mouth or nose? Yes Did you need to seek medical attention at a hospital or doctor's office? No When did it last happen? If all above answers are "NO", may proceed with cephalosporin use.   Sulfa Antibiotics Rash, Other (See Comments)      Medication List    TAKE these medications   atorvastatin 40 MG tablet Commonly known as: LIPITOR Take 40 mg by mouth at bedtime.   fluticasone 50 MCG/ACT nasal spray Commonly known as: FLONASE Place 1 spray into both nostrils daily as needed for allergies or rhinitis.   glipiZIDE 10 MG 24 hr tablet Commonly known as: GLUCOTROL XL Take 10 mg by mouth daily.   ibuprofen 600 MG tablet Commonly known as: ADVIL Take 1 tablet (600 mg total) by mouth every 8 (eight) hours as needed for fever, mild pain or moderate pain.   isosorbide mononitrate 60 MG 24 hr tablet Commonly known as: IMDUR Take 1 tablet (60 mg total) by mouth daily. What changed:   medication strength  how much to take   losartan 50 MG tablet --> HOLD FOR 3 DAYS Commonly known as: COZAAR Take 50 mg by mouth daily.   Lysine 500 MG Caps Take 500 mg by mouth daily.   Magnesium 500 MG Tabs Take 500 mg by mouth daily.   meloxicam 7.5 MG tablet Commonly known as: MOBIC Take 7.5 mg by mouth daily.   metFORMIN 1000 MG tablet Commonly known as: GLUCOPHAGE Take 1,000 mg by mouth daily with breakfast.   methocarbamol 500 MG tablet Commonly  known as: ROBAXIN Take 1 tablet (500 mg total) by mouth every 6 (six) hours as needed for muscle spasms.   oxyCODONE 5 MG immediate release tablet Commonly known as: Oxy IR/ROXICODONE Take 1 tablet (5 mg total) by mouth every 6 (six) hours as needed for severe pain or breakthrough pain.   pantoprazole 40 MG tablet Commonly known as:  PROTONIX Take 40 mg by mouth 2 (two) times daily.   sucralfate 1 g tablet Commonly known as: CARAFATE Take 1 g by mouth 2 (two) times daily.   traMADol 50 MG tablet Commonly known as: ULTRAM Take 50 mg by mouth at bedtime as needed for moderate pain.   Vitamin A 2400 MCG (8000 UT) Caps Take 8,000 Units by mouth daily.   vitamin B-12 1000 MCG tablet Commonly known as: CYANOCOBALAMIN Take 1,000 mcg by mouth daily.         Follow-up Information    [place], [place] F, MD. Schedule an appointment as soon as possible for a visit in 2 week(s).   Specialty: General Surgery Why: s/p laparoscopic paraesophageal hernia repair Contact information: [address] [place] [address] [phone]                Time spent on discharge management including discussion of hospital course, clinical condition, outpatient instructions, prescriptions, and follow up with the patient and members of the medical team: >30 minutes  -- Edison Simon , PA-C [facility]  12/15/2020, 2:48 PM [phone] M-F: 7am - 4pm

## 2020-12-15 NOTE — Care Management Obs Status (Signed)
[place] NOTIFICATION   Patient Details  Name: MARESA MORASH MRN: [id number] Date of Birth: 20-Dec-1940   Medicare Observation Status Notification Given:  Yes    Beverly Sessions, RN 12/15/2020, 12:47 PM

## 2020-12-15 NOTE — Progress Notes (Signed)
SUBJECTIVE: Patient continues to have complaints of mild chest tightness. Denies dyspnea.   Vitals:   12/15/20 0425 12/15/20 0747 12/15/20 1118 12/15/20 1145  BP: (!) 107/57 124/60 (!) 110/58 (!) 99/50  Pulse: 73 75 74 61  Resp: 18 20 18 15   Temp: 98.2 F (36.8 C) 98 F (36.7 C) 97.8 F (36.6 C) 97.9 F (36.6 C)  TempSrc: Oral Oral Oral Oral  SpO2: 93% 91% 95% 92%    Intake/Output Summary (Last 24 hours) at 12/15/2020 1205 Last data filed at 12/15/2020 1005 Gross per 24 hour  Intake 1380.61 ml  Output 1449 ml  Net -68.39 ml    LABS: Basic Metabolic Panel: Recent Labs    12/14/20 1419 12/15/20 0419  NA  --  136  K  --  3.7  CL  --  107  CO2  --  22  GLUCOSE  --  143*  BUN  --  32*  CREATININE 1.34* 1.61*  CALCIUM  --  8.2*   Liver Function Tests: No results for input(s): AST, ALT, ALKPHOS, BILITOT, PROT, ALBUMIN in the last 72 hours. No results for input(s): LIPASE, AMYLASE in the last 72 hours. CBC: Recent Labs    12/14/20 1419 12/15/20 0419  WBC 11.5* 9.2  HGB 12.1 9.9*  HCT 36.8 29.9*  MCV 95.3 95.5  PLT 217 186   Cardiac Enzymes: No results for input(s): CKTOTAL, CKMB, CKMBINDEX, TROPONINI in the last 72 hours. BNP: Invalid input(s): POCBNP D-Dimer: No results for input(s): DDIMER in the last 72 hours. Hemoglobin A1C: Recent Labs    12/14/20 1419  HGBA1C 6.8*   Fasting Lipid Panel: No results for input(s): CHOL, HDL, LDLCALC, TRIG, CHOLHDL, LDLDIRECT in the last 72 hours. Thyroid Function Tests: No results for input(s): TSH, T4TOTAL, T3FREE, THYROIDAB in the last 72 hours.  Invalid input(s): FREET3 Anemia Panel: No results for input(s): VITAMINB12, FOLATE, FERRITIN, TIBC, IRON, RETICCTPCT in the last 72 hours.   PHYSICAL EXAM General: Well developed, well nourished, in no acute distress HEENT:  Normocephalic and atramatic Neck:  No JVD.  Lungs: Clear bilaterally to auscultation and percussion. Heart: HRRR . Normal S1 and S2 without  gallops or murmurs.  Abdomen: Bowel sounds are positive, abdomen soft and non-tender  Msk:  Back normal, normal gait. Normal strength and tone for age. Extremities: No clubbing, cyanosis or edema.   Neuro: Alert and oriented X 3. Psych:  Good affect, responds appropriately  TELEMETRY: NSR 72/bpm  ASSESSMENT AND PLAN: Patient with complaints of chest pain following hernia repair surgery. Mild troponin increase which is now downtrending. Most likely d/t demand ischemia from the surgery as well as hypertensive urgency yesterday. BP now stable after restarting home Losartan-HCTZ. Patient had Coronary CTA <1 year ago with only minor luminal irregularities. From a cardiac point of view patient is stable. Upon discharge we will see her in our office next week for further work up.  Active Problems:   S/P repair of paraesophageal hernia    Adaline Sill, NP-C 12/15/2020 12:05 PM

## 2020-12-15 NOTE — Progress Notes (Signed)
Appointment made for the patient for 2 weeks, patient needs to make another appointment with Cardiology, educated on medications especially Losartan & Imdur, IV taken out by Nursing student, patient forgetful throughout discharge education, & Edison Simon, PA aware of neuro status. Awaiting wheelchair. AVS given to patient along with diet worksheet.

## 2020-12-15 NOTE — Discharge Instructions (Signed)
Hernia, Adult     A hernia happens when tissue inside your body pushes out through a weak spot in your belly muscles (abdominal wall). This makes a round lump (bulge). The lump may be:  In a scar from surgery that was done in your belly (incisional hernia).  Near your belly button (umbilical hernia).  In your groin (inguinal hernia). Your groin is the area where your leg meets your lower belly (abdomen). This kind of hernia could also be: ? In your scrotum, if you are female. ? In folds of skin around your vagina, if you are female.  In your upper thigh (femoral hernia).  Inside your belly (hiatal hernia). This happens when your stomach slides above the muscle between your belly and your chest (diaphragm). If your hernia is small and it does not cause pain, you may not need treatment. If your hernia is large or it causes pain, you may need surgery. Follow these instructions at home: Activity  Avoid stretching or overusing (straining) the muscles near your hernia. Straining can happen when you: ? Lift something heavy. ? Poop (have a bowel movement).  Do not lift anything that is heavier than 10 lb (4.5 kg), or the limit that you are told, until your doctor says that it is safe.  Use the strength of your legs when you lift something heavy. Do not use only your back muscles to lift. General instructions  Do these things if told by your doctor so you do not have trouble pooping (constipation): ? Drink enough fluid to keep your pee (urine) pale yellow. ? Eat foods that are high in fiber. These include fresh fruits and vegetables, whole grains, and beans. ? Limit foods that are high in fat and processed sugars. These include foods that are fried or sweet. ? Take medicine for trouble pooping.  When you cough, try to cough gently.  You may try to push your hernia in by very gently pressing on it when you are lying down. Do not try to force the bulge back in if it will not push in  easily.  If you are overweight, work with your doctor to lose weight safely.  Do not use any products that have nicotine or tobacco in them. These include cigarettes and e-cigarettes. If you need help quitting, ask your doctor.  If you will be having surgery (hernia repair), watch your hernia for changes in shape, size, or color. Tell your doctor if you see any changes.  Take over-the-counter and prescription medicines only as told by your doctor.  Keep all follow-up visits as told by your doctor. Contact a doctor if:  You get new pain, swelling, or redness near your hernia.  You poop fewer times in a week than normal.  You have trouble pooping.  You have poop (stool) that is more dry than normal.  You have poop that is harder or larger than normal. Get help right away if:  You have a fever.  You have belly pain that gets worse.  You feel sick to your stomach (nauseous).  You throw up (vomit).  Your hernia cannot be pushed in by very gently pressing on it when you are lying down. Do not try to force the bulge back in if it will not push in easily.  Your hernia: ? Changes in shape or size. ? Changes color. ? Feels hard or it hurts when you touch it. These symptoms may represent a serious problem that is an emergency. Do  not wait to see if the symptoms will go away. Get medical help right away. Call your local emergency services (911 in the [place] Summary  A hernia happens when tissue inside your body pushes out through a weak spot in the belly muscles. This creates a bulge.  If your hernia is small and it does not hurt, you may not need treatment. If your hernia is large or it hurts, you may need surgery.  If you will be having surgery, watch your hernia for changes in shape, size, or color. Tell your doctor about any changes. This information is not intended to replace advice given to you by your health care provider. Make sure you discuss any questions you have with  your health care provider. Document Revised: 12/19/2018 Document Reviewed: 05/30/2017 Elsevier Patient Education  2021 [employer]. In addition to included general post-operative instructions,  Diet: Resume home diet. Follow Nissen diet handout x4 weeks  Activity: No heavy lifting >20 pounds (children, pets, laundry, garbage) for 4 weeks, but light activity and walking are encouraged. Do not drive or drink alcohol if taking narcotic pain medications or having pain that might distract from driving.  Wound care: 2 days after surgery (04/07), you may shower/get incision wet with soapy water and pat dry (do not rub incisions), but no baths or submerging incision underwater until follow-up.   Blood Pressure Medications: Please hold your blood pressure medications (Losartan) for the next 3 days. Monitor your BP at home. You will follow up with cardiology in about 1 week. We also halved your dose of Imdur to 60 mg, continue this for now until follow up with cardiology.   Medications: Resume all home medications with exception to blood pressure medications which I have outlined above. For mild to moderate pain: acetaminophen (Tylenol) or ibuprofen/naproxen (if no kidney disease). Combining Tylenol with alcohol can substantially increase your risk of causing liver disease. Narcotic pain medications, if prescribed, can be used for severe pain, though may cause nausea, constipation, and drowsiness. Do not combine Tylenol and Percocet (or similar) within a 6 hour period as Percocet (and similar) contain(s) Tylenol. If you do not need the narcotic pain medication, you do not need to fill the prescription.  Call office [phone] / [phone]) at any time if any questions, worsening pain, fevers/chills, bleeding, drainage from incision site, or other concerns.

## 2020-12-15 NOTE — Progress Notes (Addendum)
[facility] Day(s): 1.   Post op day(s): 1 Day Post-Op.   Interval History:  Patient seen and examined No acute events or new complaints overnight.  Patient reports she feels pretty "foggy" this morning Additionally, she reports that "she can't pee," feels like she has to but can not She has minimal abdominal pain No fever, chills, nausea, emesis She is without leukocytosis, WBC 9.2K Hgb to 9.9 but suspect this is dilutional  Slight bump in sCr to 1.61, UO - 440 ccs No electrolyte derangements High sensitivity troponin trended: 248 --> 302 --> 308 She is on full liquid diet; tolerating well   Vital signs in last 24 hours: [min-max] current  Temp:  [97 F (36.1 C)-98.4 F (36.9 C)] 98.2 F (36.8 C) (04/06 0425) Pulse Rate:  [72-95] 73 (04/06 0425) Resp:  [13-26] 18 (04/06 0425) BP: (107-186)/(57-98) 107/57 (04/06 0425) SpO2:  [93 %-100 %] 93 % (04/06 0425)             Intake/Output last 2 shifts:  04/05 0701 - 04/06 0700 In: 1400.6 [P.O.:240; I.V.:1160.6] Out: 470 [Urine:440; Blood:30]   Physical Exam:  Constitutional: alert, cooperative and no distress, she does appear slightly disoriented Respiratory: breathing non-labored at rest  Cardiovascular: regular rate and sinus rhythm  Gastrointestinal: Soft, minimal incisional soreness, non-distended, no rebound/guarding Integumentary: Laparoscopic incisions are CDI with dermabond, no erythema or drainage   Labs:  CBC Latest Ref Rng & Units 12/15/2020 12/14/2020 12/10/2020  WBC 4.0 - 10.5 K/uL 9.2 11.5(H) 5.2  Hemoglobin 12.0 - 15.0 g/dL 9.9(L) 12.1 12.3  Hematocrit 36.0 - 46.0 % 29.9(L) 36.8 33.8(L)  Platelets 150 - 400 K/uL 186 217 232   CMP Latest Ref Rng & Units 12/15/2020 12/14/2020 12/10/2020  Glucose 70 - 99 mg/dL 143(H) - 194(H)  BUN 8 - 23 mg/dL 32(H) - 28(H)  Creatinine 0.44 - 1.00 mg/dL 1.61(H) 1.34(H) 1.30(H)  Sodium 135 - 145 mmol/L 136 - 138  Potassium 3.5 - 5.1  mmol/L 3.7 - 3.6  Chloride 98 - 111 mmol/L 107 - 105  CO2 22 - 32 mmol/L 22 - 25  Calcium 8.9 - 10.3 mg/dL 8.2(L) - 9.6  Total Protein 6.5 - 8.1 g/dL - - -  Total Bilirubin 0.3 - 1.2 mg/dL - - -  Alkaline Phos 38 - 126 U/L - - -  AST 15 - 41 U/L - - -  ALT 0 - 44 U/L - - -    Imaging studies: No new pertinent imaging studies   Assessment/Plan:  80 y.o. female with AKI, likely under resuscitation, 1 Day Post-Op s/p robotic assisted laparoscopic paraesophageal hernia repair and Nissen fundoplication.   - Okay to continue full liquids / advance to soft diet; I did provide her a handout regarding dietary recommendations x4 weeks   - Will increase IVF to 100 ml/hr, added albumin  - Will bladder scan to ensure no urinary retention  - Monitor abdominal examination; on-going bowel function  - Pain control prn; discontinued Toradol given renal function  - Antiemetics prn  - Appreciate cardiology assistance; d/w with NP this morning, no further work up, she will follow up in 1 week as outpatient   - Mobilization encouraged     - Discharge planning; I do not think she is quite ready for home. Will rehydrate and ensure she can urinate. Potentially home in afternoon if making good improvements and no further work up warranted.    All of the above  findings and recommendations were discussed with the patient, and the medical team, and all of patient's questions were answered to her expressed satisfaction.  -- Edison Simon, PA-C [facility] 12/15/2020, 7:18 AM [phone] M-F: 7am - 4pm

## 2020-12-16 NOTE — Anesthesia Postprocedure Evaluation (Signed)
Anesthesia Post Note  Patient: NEVAEH KORTE  Procedure(s) Performed: XI ROBOTIC ASSISTED PARAESOPHAGEAL HERNIA REPAIR, RNFA to assist (N/A )  Patient location during evaluation: PACU Anesthesia Type: General Level of consciousness: awake and alert and oriented Pain management: pain level controlled Vital Signs Assessment: post-procedure vital signs reviewed and stable Respiratory status: spontaneous breathing Cardiovascular status: blood pressure returned to baseline Anesthetic complications: no Comments: Transient chest pain in PACU.  Cardiology consulted and evaluated.   No complications documented.   Last Vitals:  Vitals:   12/15/20 1145 12/15/20 1530  BP: (!) 99/50 112/68  Pulse: 61 68  Resp: 15   Temp: 36.6 C 36.7 C  SpO2: 92% 98%    Last Pain:  Vitals:   12/15/20 1530  TempSrc: Oral  PainSc:                  Bryse Blanchette

## 2020-12-23 ENCOUNTER — Other Ambulatory Visit
Admission: RE | Admit: 2020-12-23 | Discharge: 2020-12-23 | Disposition: A | Discharge status: Home / Self Care | Payer: Medicare Other | Source: Ambulatory Visit | Attending: Internal Medicine | Admitting: Internal Medicine

## 2020-12-23 DIAGNOSIS — I2510 Atherosclerotic heart disease of native coronary artery without angina pectoris: Secondary | ICD-10-CM | POA: Diagnosis present

## 2020-12-23 LAB — TROPONIN I (HIGH SENSITIVITY): Troponin I (High Sensitivity): 11 ng/L (ref <18)

## 2021-01-03 ENCOUNTER — Other Ambulatory Visit: Payer: Self-pay

## 2021-01-03 ENCOUNTER — Encounter: Payer: Self-pay | Admitting: Surgery

## 2021-01-03 ENCOUNTER — Ambulatory Visit (INDEPENDENT_AMBULATORY_CARE_PROVIDER_SITE_OTHER): Payer: Medicare Other | Admitting: Surgery

## 2021-01-03 VITALS — BP 180/78 | HR 64 | Temp 98.2°F | Ht 59.0 in | Wt 114.8 lb

## 2021-01-03 DIAGNOSIS — Z09 Encounter for follow-up examination after completed treatment for conditions other than malignant neoplasm: Secondary | ICD-10-CM

## 2021-01-03 MED ORDER — ONDANSETRON HCL 4 MG PO TABS: 4.0000 mg | ORAL_TABLET | Freq: Four times a day (QID) | ORAL | 0 refills | Status: AC | PRN

## 2021-01-03 NOTE — Patient Instructions (Addendum)
Please don't eat any bread. You may eat soft foods. Take tylenol for your pain and STOP the Oxycodone and Ibuprofen.  Pick up your prescription at [facility]. See follow up appointment below. If you have any concerns or questions, please feel free to contact our office.    GENERAL POST-OPERATIVE PATIENT INSTRUCTIONS   WOUND CARE INSTRUCTIONS:  Keep a dry clean dressing on the wound if there is drainage. The initial bandage may be removed after 24 hours.  Once the wound has quit draining you may leave it open to air.  If clothing rubs against the wound or causes irritation and the wound is not draining you may cover it with a dry dressing during the daytime.  Try to keep the wound dry and avoid ointments on the wound unless directed to do so.  If the wound becomes bright red and painful or starts to drain infected material that is not clear, please contact your physician immediately.  If the wound is mildly pink and has a thick firm ridge underneath it, this is normal, and is referred to as a healing ridge.  This will resolve over the next 4-6 weeks.  BATHING: You may shower if you have been informed of this by your surgeon. However, Please do not submerge in a tub, hot tub, or pool until incisions are completely sealed or have been told by your surgeon that you may do so.  DIET:  You may eat any foods that you can tolerate.  It is a good idea to eat a high fiber diet and take in plenty of fluids to prevent constipation.  If you do become constipated you may want to take a mild laxative or take ducolax tablets on a daily basis until your bowel habits are regular.  Constipation can be very uncomfortable, along with straining, after recent surgery.  ACTIVITY:  You are encouraged to cough and deep breath or use your incentive spirometer if you were given one, every 15-30 minutes when awake.  This will help prevent respiratory complications and low grade fevers post-operatively if you had a general  anesthetic.  You may want to hug a pillow when coughing and sneezing to add additional support to the surgical area, if you had abdominal or chest surgery, which will decrease pain during these times.  You are encouraged to walk and engage in light activity for the next two weeks.  You should not lift more than 10-20 pounds, until 01/25/2021 as it could put you at increased risk for complications.  Twenty pounds is roughly equivalent to a plastic bag of groceries. At that time- Listen to your body when lifting, if you have pain when lifting, stop and then try again in a few days. Soreness after doing exercises or activities of daily living is normal as you get back in to your normal routine.  MEDICATIONS:  Try to take narcotic medications and anti-inflammatory medications, such as tylenol, ibuprofen, naprosyn, etc., with food.  This will minimize stomach upset from the medication.  Should you develop nausea and vomiting from the pain medication, or develop a rash, please discontinue the medication and contact your physician.  You should not drive, make important decisions, or operate machinery when taking narcotic pain medication.  SUNBLOCK Use sun block to incision area over the next year if this area will be exposed to sun. This helps decrease scarring and will allow you avoid a permanent darkened area over your incision.     Soft-Food Eating Plan A  soft-food eating plan includes foods that are safe and easy to chew and swallow. Your health care provider or dietitian can help you find foods and flavors that fit into this plan. Follow this plan until your health care provider or dietitian says it is safe to start eating other foods and food textures. What are tips for following this plan? General guidelines  Take small bites of food, or cut food into pieces about  inch or smaller. Bite-sized pieces of food are easier to chew and swallow.  Eat moist foods. Avoid overly dry foods.  Avoid foods  that: ? Are difficult to swallow, such as dry, chunky, crispy, or sticky foods. ? Are difficult to chew, such as hard, tough, or stringy foods. ? Contain nuts, seeds, or fruits.  Follow instructions from your dietitian about the types of liquids that are safe for you to swallow. You may be allowed to have: ? Thick liquids only. This includes only liquids that are thicker than honey. ? Thin and thick liquids. This includes all beverages and foods that become liquid at room temperature.  To make thick liquids: ? Purchase a commercial liquid thickening powder. These are available at grocery stores and pharmacies. ? Mix the thickener into liquids according to instructions on the label. ? Purchase ready-made thickened liquids. ? Thicken soup by pureeing, straining to remove chunks, and adding flour, potato flakes, or corn starch. ? Add commercial thickener to foods that become liquid at room temperature, such as milk shakes, yogurt, ice cream, gelatin, and sherbet.  Ask your health care provider whether you need to take a fiber supplement.   Cooking  Cook meats so they stay tender and moist. Use methods like braising, stewing, or baking in liquid.  Cook vegetables and fruit until they are soft enough to be mashed with a fork.  Peel soft, fresh fruits such as peaches, nectarines, and melons.  When making soup, make sure chunks of meat and vegetables are smaller than  inch.  Reheat leftover foods slowly so that a tough crust does not form. What foods are allowed? The items listed below may not be a complete list. Talk with your dietitian about what dietary choices are best for you. Grains Breads, muffins, pancakes, or waffles moistened with syrup, jelly, or butter. Dry cereals well-moistened with milk. Moist, cooked cereals. Well-cooked pasta and rice. Vegetables All soft-cooked vegetables. Shredded lettuce. Fruits All canned and cooked fruits. Soft, peeled fresh fruits.  Strawberries. Dairy Milk. Cream. Yogurt. Cottage cheese. Soft cheese without the rind. Meats and other protein foods Tender, moist ground meat, poultry, or fish. Meat cooked in gravy or sauces. Eggs. Sweets and desserts Ice cream. Milk shakes. Sherbet. Pudding. Fats and oils Butter. Margarine. Olive, canola, sunflower, and grapeseed oil. Smooth salad dressing. Smooth cream cheese. Mayonnaise. Gravy. What foods are not allowed? The items listed bemay not be a complete list. Talk with your dietitian about what dietary choices are best for you. Grains Coarse or dry cereals, such as bran, granola, and shredded wheat. Tough or chewy crusty breads, such as [place] bread or baguettes. Breads with nuts, seeds, or fruit. Vegetables All raw vegetables. Cooked corn. Cooked vegetables that are tough or stringy. Tough, crisp, fried potatoes and potato skins. Fruits Fresh fruits with skins or seeds, or both, such as apples, pears, and grapes. Stringy, high-pulp fruits, such as papaya, pineapple, coconut, and mango. Fruit leather and all dried fruit. Dairy Yogurt with nuts or coconut. Meats and other protein foods Hard, dry sausages.  Dry meat, poultry, or fish. Meats with gristle. Fish with bones. Fried meat or fish. Lunch meat and hotdogs. Nuts and seeds. Chunky peanut butter or other nut butters. Sweets and desserts Cakes or cookies that are very dry or chewy. Desserts with dried fruit, nuts, or coconut. Fried pastries. Very rich pastries. Fats and oils Cream cheese with fruit or nuts. Salad dressings with seeds or chunks. Summary  A soft-food eating plan includes foods that are safe and easy to swallow. Generally, the foods should be soft enough to be mashed with a fork.  Avoid foods that are dry, hard to chew, crunchy, sticky, stringy, or crispy.  Ask your health care provider whether you need to thicken your liquids and if you need to take a fiber supplement. This information is not intended to  replace advice given to you by your health care provider. Make sure you discuss any questions you have with your health care provider. Document Revised: 12/19/2018 Document Reviewed: 10/31/2016 Elsevier Patient Education  2021 [employer].

## 2021-01-05 ENCOUNTER — Encounter: Payer: Self-pay | Admitting: Surgery

## 2021-01-05 NOTE — Progress Notes (Signed)
Claudia Schaefer is a 80 year old female status post robotic paraesophageal hernia repair with mesh.  Her main issue seems to be nausea.  She does not relate any dysphagia but does report some bloating.  No fevers no chills some abdominal discomfort.  She does have some retrosternal discomfort.  She did have demand ischemia cardiac event perioperatively and was seen by cardiology.  She does have an appointment with cardiology tomorrow.  Her symptoms are not typical for MI. She has had difficulty following and being compliant with diet.  She states that she had grilled cheese sandwich last night.  She is able to keep some fluids down but there is others that she cannot.  This is related to her nausea.  PE NAD, alert She ambulates normally.  Her vital signs are stable and she is afebrile. CV: NSR, HR 60s, no murmurs Abdomen: Soft there is no evidence of infection incisions are healing well.  There is no peritonitis  A/P nausea and bloating not unexpected following large paraesophageal hernia.  We will prescribe Zofran as needed.  Advised about the importance of compliance with Nissen diet.  Also encouraged her to keep appointment with cardiology. I will see her back in a few weeks.  She knows to call back if she has questions or concerns.

## 2021-01-24 ENCOUNTER — Encounter: Payer: Self-pay | Admitting: Surgery

## 2021-01-24 ENCOUNTER — Ambulatory Visit (INDEPENDENT_AMBULATORY_CARE_PROVIDER_SITE_OTHER): Payer: Medicare Other | Admitting: Surgery

## 2021-01-24 VITALS — BP 122/74 | HR 105 | Temp 97.9°F | Ht 59.0 in | Wt 114.4 lb

## 2021-01-24 DIAGNOSIS — Z09 Encounter for follow-up examination after completed treatment for conditions other than malignant neoplasm: Secondary | ICD-10-CM

## 2021-01-24 NOTE — Patient Instructions (Addendum)
If you have any concerns or questions, please feel free to call our office. See follow up appointment below. 

## 2021-01-24 NOTE — Progress Notes (Signed)
Claudia Schaefer is a 80 year old female status post robotic paraesophageal hernia repair with mesh 12/14/20.  She has seen cardiologist and a stress test was done, She will have CT angiogram for further w/u. She does have some intermittent atypical chest pain and palpitations. Cards is aware and is following her. She apparently had decrease smell and taste but is slowly improving She also states she developed thrush but has improved No reflux Taking PO, no dysphagia, no reflux  PE NAD, alert She ambulates normally.  Her vital signs are stable and she is afebrile. Abdomen: Soft, there is no evidence of infection incisions are healing well.  There is no peritonitis  A/p Doing well , no surgical issues F/U card RTC 3 months

## 2021-06-08 ENCOUNTER — Encounter: Payer: Self-pay | Admitting: Surgery

## 2021-06-08 ENCOUNTER — Other Ambulatory Visit: Payer: Self-pay

## 2021-06-08 ENCOUNTER — Ambulatory Visit: Payer: Medicare Other | Admitting: Surgery

## 2021-06-08 VITALS — BP 156/81 | HR 60 | Temp 98.6°F | Ht 59.0 in | Wt 126.8 lb

## 2021-06-08 DIAGNOSIS — K449 Diaphragmatic hernia without obstruction or gangrene: Secondary | ICD-10-CM | POA: Diagnosis not present

## 2021-06-08 DIAGNOSIS — Z09 Encounter for follow-up examination after completed treatment for conditions other than malignant neoplasm: Secondary | ICD-10-CM

## 2021-06-08 NOTE — Patient Instructions (Signed)
   Follow-up with our office as needed.  Please call and ask to speak with a nurse if you develop questions or concerns.  

## 2021-06-14 ENCOUNTER — Encounter: Payer: Self-pay | Admitting: Surgery

## 2021-06-14 NOTE — Progress Notes (Signed)
Outpatient Surgical Follow Up  06/14/2021  Claudia Schaefer is an 80 y.o. female.   Chief Complaint  Patient presents with   Follow-up    HPI: Claudia Schaefer is a 80 year old female status post paraesophageal hernia repair robotically.  She is doing well without reflux there is no dysphagia her pulmonary symptoms have significantly improved.  She does have some diarrhea that is chronic and has some chronic issues that have been present even before her surgery.  Past Medical History:  Diagnosis Date   CAD (coronary artery disease)    Cancer ([place])    skin   Chronic kidney disease    Diabetes mellitus without complication (HCC)    Fibromyalgia    GERD (gastroesophageal reflux disease)    History of hiatal hernia    Hypertension    Osteoarthritis    Pulmonary fibrosis ([place])     Past Surgical History:  Procedure Laterality Date   ABDOMINAL HYSTERECTOMY     BREAST BIOPSY Right    neg   COLONOSCOPY     ENDOSCOPY     TONSILLECTOMY     XI ROBOTIC ASSISTED PARAESOPHAGEAL HERNIA REPAIR N/A 12/14/2020   Procedure: XI ROBOTIC ASSISTED PARAESOPHAGEAL HERNIA REPAIR, RNFA to assist;  Surgeon: Jules Husbands, MD;  Location: [facility];  Service: General;  Laterality: N/A;    Family History  Problem Relation Age of Onset   Breast cancer Neg Hx     Social History:  reports that she has never smoked. She has never used smokeless tobacco. She reports current alcohol use. She reports that she does not use drugs.  Allergies:  Allergies  Allergen Reactions   Penicillins Rash    Did it involve swelling of the face/tongue/throat, SOB, or low BP? No Did it involve sudden or severe rash/hives, skin peeling, or any reaction on the inside of your mouth or nose? Yes Did you need to seek medical attention at a hospital or doctor's office? No When did it last happen?       If all above answers are "NO", may proceed with cephalosporin use.   Sulfa Antibiotics Rash and Other (See Comments)     Medications reviewed.    ROS Full ROS performed and is otherwise negative other than what is stated in HPI   BP (!) 156/81   Pulse 60   Temp 98.6 F (37 C)   Ht 4\' 11"  (1.499 m)   Wt 126 lb 12.8 oz (57.5 kg)   SpO2 97%   BMI 25.61 kg/m   Physical Exam     No results found for this or any previous visit (from the past 48 hour(s)). No results found.  Assessment/Plan: Claudia Schaefer is a 80 year old female status post paraesophageal hernia.  Currently no evidence of complications related to her surgery.  She does have some nonspecific functional GI issues mainly from:.  There is are not related to her surgery.  She has no reflux and her respiratory symptoms have improved.  Currently no further testing work-up required from my standpoint of view.  We will follow her up on a as needed basis   Greater than 50% of the 20 minutes  visit was spent in counseling/coordination of care   Caroleen Hamman, MD [facility]

## 2022-06-14 IMAGING — RF DG ESOPHAGUS
14 of 23 series · 14 of 24 positions shown · non-contrast
Comparison: CT chest abdomen pelvis from 8887 and CT chest, abdomen
and pelvis from Monday October, 2020

CLINICAL DATA: Hiatal hernia in a patient with lung disease
presumed related to longstanding reflux, also with intermittent
chest pain and abdominal pain.

EXAM:
ESOPHOGRAM/BARIUM SWALLOW
TECHNIQUE: Single contrast examination was performed using  thin barium.
FLUOROSCOPY TIME:  Fluoroscopy Time:  2 minutes 27 seconds
Radiation Exposure Index (if provided by the fluoroscopic device):
19.9 mGy
Number of Acquired Spot Images: 0

[Series 1: cp_standard · 0.18mm/px · 1 of 60 frames shown (1 of 14)]
[frame 10/60]
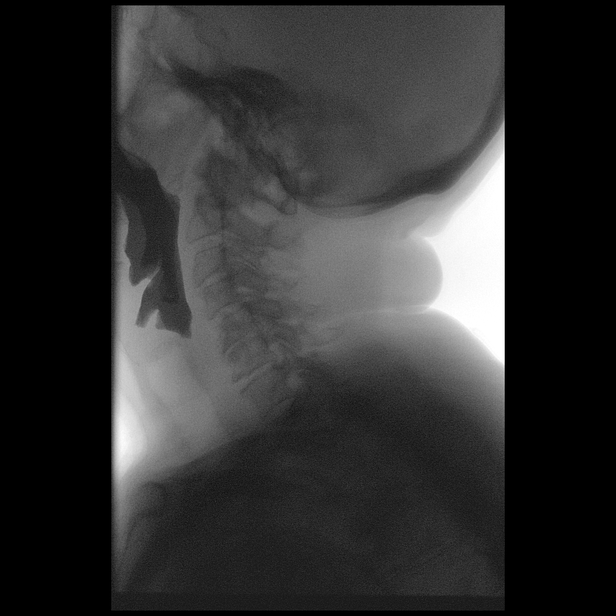

[Series 3: cp_standard · 0.19mm/px · 1 of 38 frames shown (2 of 14)]
[frame 6/38]
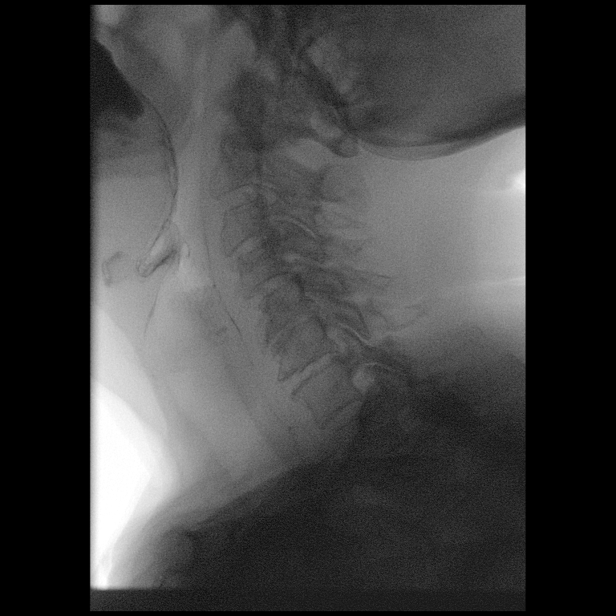

[Series 5: cp_standard · 0.19mm/px · 1 of 26 frames shown (3 of 14)]
[frame 2/26]
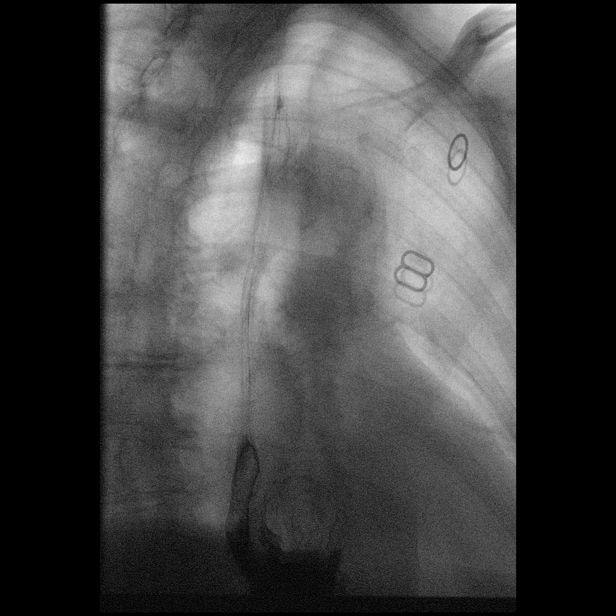

[Series 6: cp_standard · 0.19mm/px · 1 of 61 frames shown (4 of 14)]
[frame 52/61]
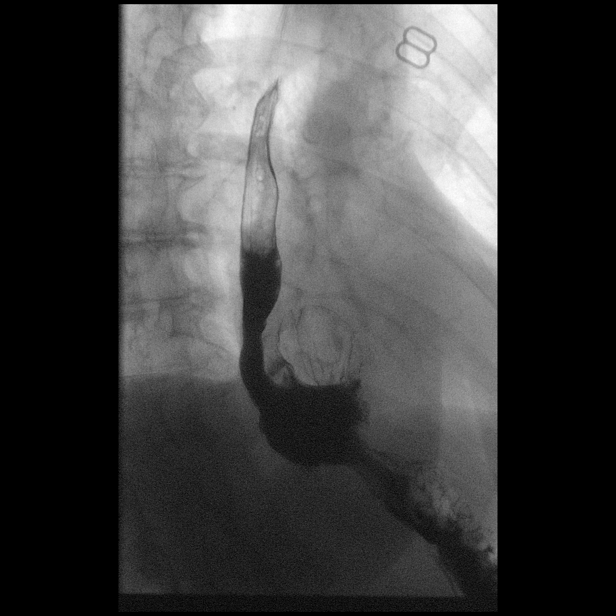

[Series 7: cp_standard · 0.19mm/px · 1 of 69 frames shown (5 of 14)]
[frame 59/69]
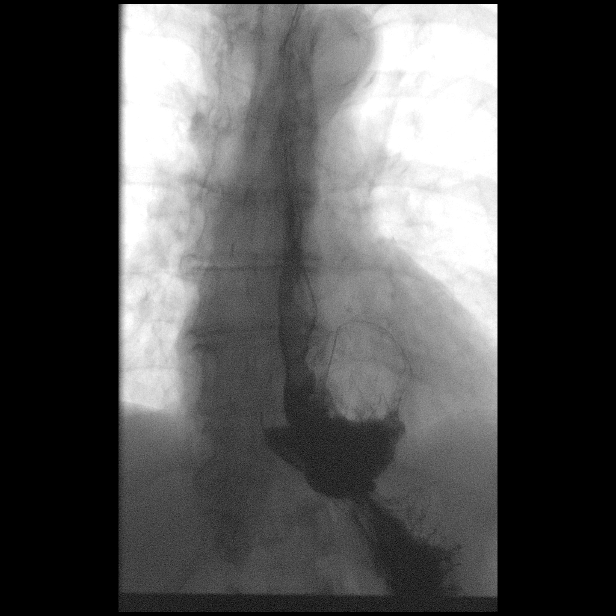

[Series 9: cp_standard · 0.19mm/px · 1 of 33 frames shown (6 of 14)]
[frame 29/33]
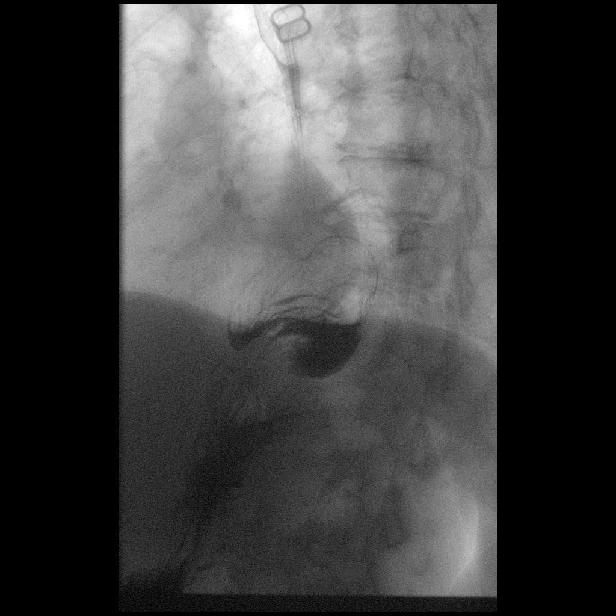

[Series 12: cp_standard · 0.18mm/px · 1 of 14 frames shown (7 of 14)]
[frame 3/14]
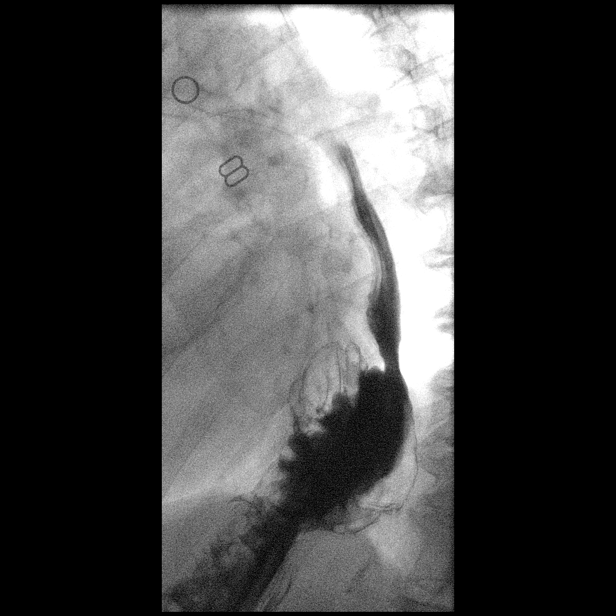

[Series 13: cp_standard · 0.18mm/px · 1 of 68 frames shown (8 of 14)]
[frame 35/68]
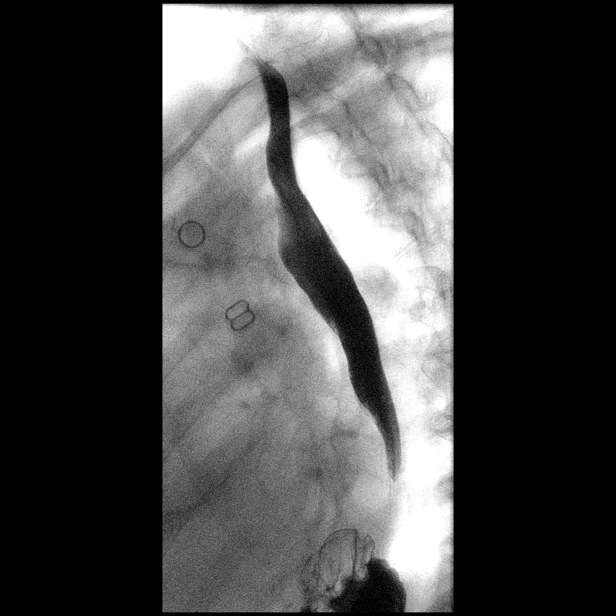

[Series 15: cp_standard · 0.18mm/px · 1 of 1 slices shown (9 of 14)]
[im 1/1]
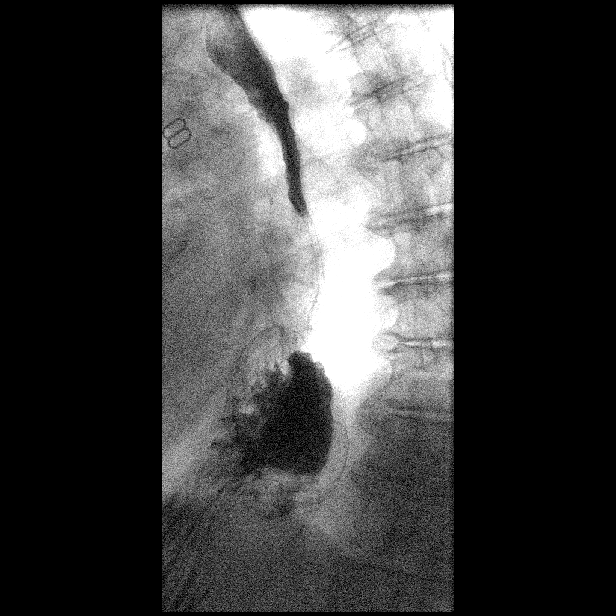

[Series 17: cp_standard · 0.18mm/px · 1 of 29 frames shown (10 of 14)]
[frame 15/29]
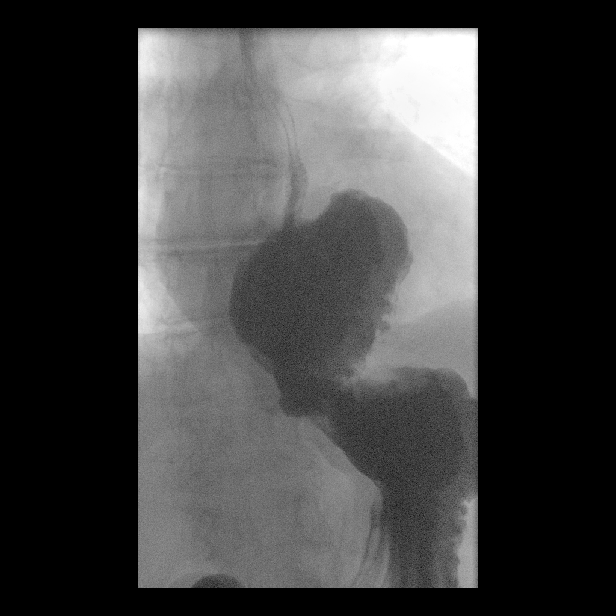

[Series 19: cp_standard · 0.18mm/px · 1 of 14 frames shown (11 of 14)]
[frame 8/14]
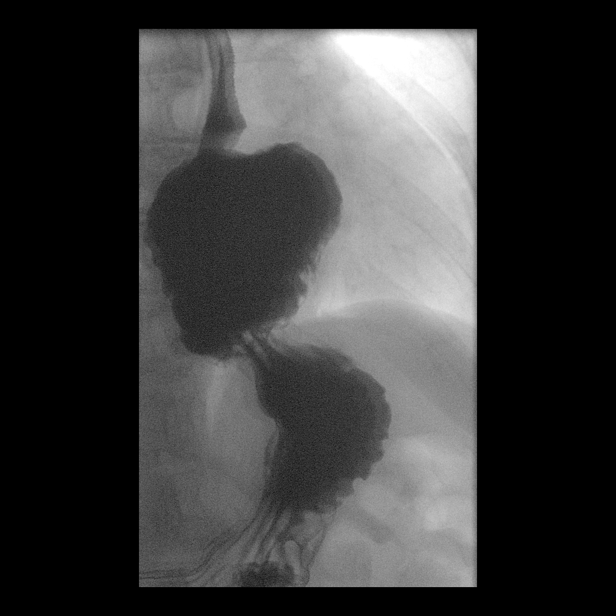

[Series 20: cp_standard · 0.18mm/px · 1 of 4 frames shown (12 of 14)]
[frame 2/4]
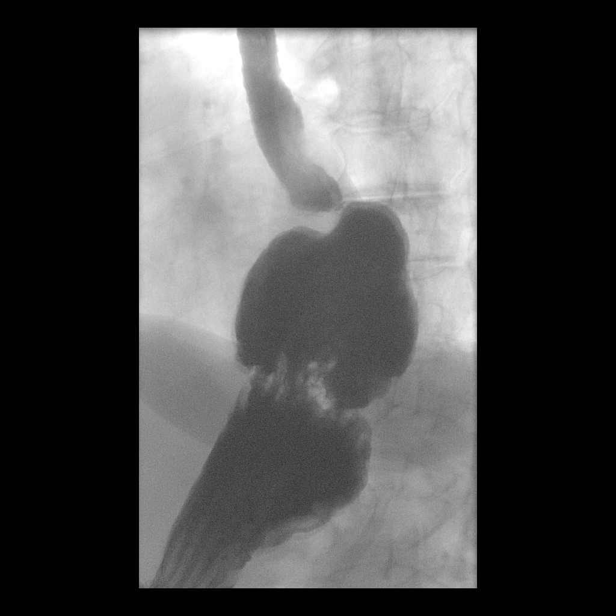

[Series 22: cp_standard · 0.18mm/px · 1 of 62 frames shown (13 of 14)]
[frame 31/62]
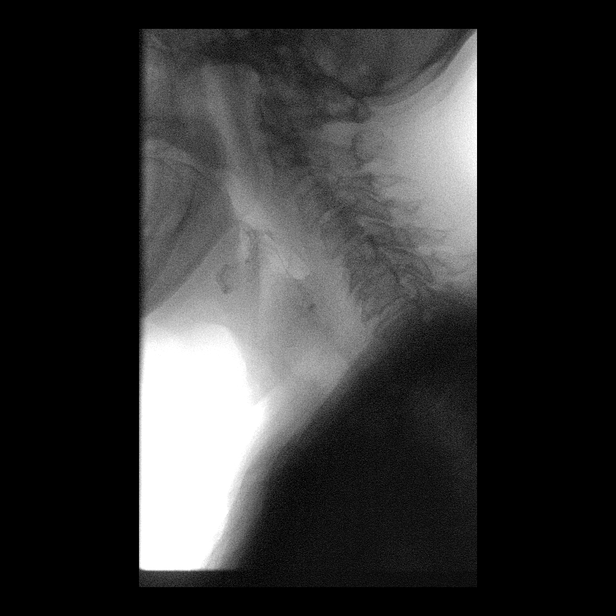

[Series 24: cp_standard · 0.29mm/px · 1 of 1 slices shown (14 of 14)]
[im 1/1]
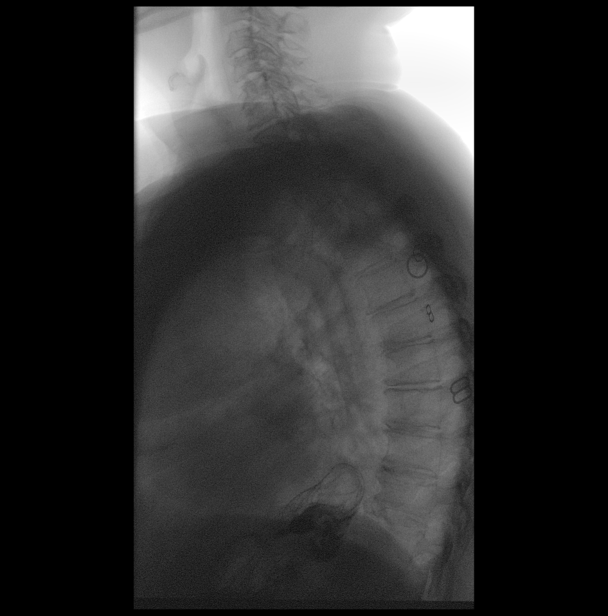

[14 of 24 positions shown; findings below may reference images not displayed]

FINDINGS: Lateral projection with limited swallow assessment shows deep
penetration to just above the vocal cords of ingested contrast
media. For this reason high-density barium was not administered and
swallowing was observed during all portions of the examination. No
gross aspiration.

Esophagus was normally distensible throughout as well as could be
assessed due to limited distension due to amounts of ingested
contrast material.

Contrast flowed into the stomach showing approximately 50% of the
stomach herniated into the chest with paraesophageal and sliding
type configuration.

Single swallow assessment showed intact primary wave. However, on
second swallow there was proximal escape of the bolus to the
proximal esophagus with some stasis in till subsequent swallow.

Full column assessment limited due to amount of contrast could be
ingested due to patient discomfort and due to concerns regarding
aspiration.

The hernia was more pronounced in supine position. Reflux was
demonstrated to the level of the proximal esophagus during the
evaluation.

Barium tablet was administered passing easily into the stomach. Did
not pass however beyond the hiatal hernia during observation.
IMPRESSION: 1. Deep penetration to just above the vocal cords of ingested
contrast media. No gross aspiration was observed. The patient may
benefit from dedicated swallow assessment.
2. Moderately large paraesophageal and sliding type, mixed type
hernia as described.
3. Esophageal dysmotility and reflux.
